# Patient Record
Sex: Female | Born: 2000 | Hispanic: Yes | Marital: Married | Smoking: Never smoker
Health system: Southern US, Community
[De-identification: ages and names within clinical notes are randomized; demographics above are authoritative.]

## PROBLEM LIST (undated history)

## (undated) ENCOUNTER — Emergency Department: Discharge status: Absent without leave | Payer: Self-pay

## (undated) ENCOUNTER — Inpatient Hospital Stay (HOSPITAL_COMMUNITY): Payer: Self-pay

## (undated) DIAGNOSIS — R519 Headache, unspecified: Secondary | ICD-10-CM

## (undated) DIAGNOSIS — K219 Gastro-esophageal reflux disease without esophagitis: Secondary | ICD-10-CM

## (undated) DIAGNOSIS — N83209 Unspecified ovarian cyst, unspecified side: Secondary | ICD-10-CM

## (undated) HISTORY — PX: NO PAST SURGERIES: SHX2092

## (undated) HISTORY — DX: Gastro-esophageal reflux disease without esophagitis: K21.9

---
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
## 2021-01-25 NOTE — Telephone Encounter (Signed)
Appointment has been schedule for depo

## 2021-02-01 ENCOUNTER — Ambulatory Visit: Payer: Medicaid Other

## 2021-02-01 ENCOUNTER — Ambulatory Visit (INDEPENDENT_AMBULATORY_CARE_PROVIDER_SITE_OTHER): Payer: Medicaid Other

## 2021-02-01 ENCOUNTER — Other Ambulatory Visit: Payer: Self-pay

## 2021-02-01 DIAGNOSIS — Z3042 Encounter for surveillance of injectable contraceptive: Secondary | ICD-10-CM | POA: Diagnosis not present

## 2021-02-01 MED ORDER — MEDROXYPROGESTERONE ACETATE 150 MG/ML IM SUSP
150.0000 mg | Freq: Once | INTRAMUSCULAR | Status: AC
  Administered 2021-02-01: 150 mg via INTRAMUSCULAR

## 2021-03-27 ENCOUNTER — Emergency Department (HOSPITAL_COMMUNITY)
Admission: EM | Admit: 2021-03-27 | Discharge: 2021-03-27 | Discharge status: Against Medical Advice | Payer: Medicaid Other | Attending: Emergency Medicine | Admitting: Emergency Medicine

## 2021-03-27 ENCOUNTER — Other Ambulatory Visit: Payer: Self-pay

## 2021-03-27 ENCOUNTER — Encounter (HOSPITAL_COMMUNITY): Payer: Self-pay | Admitting: Emergency Medicine

## 2021-03-27 DIAGNOSIS — R519 Headache, unspecified: Secondary | ICD-10-CM | POA: Insufficient documentation

## 2021-03-27 DIAGNOSIS — Z5321 Procedure and treatment not carried out due to patient leaving prior to being seen by health care provider: Secondary | ICD-10-CM | POA: Insufficient documentation

## 2021-03-27 NOTE — ED Triage Notes (Signed)
Pt c/o headache since 2300 last night; has taken 1 dose of ibuprofen and 1 dose of tylenol  ?

## 2021-03-28 ENCOUNTER — Encounter (HOSPITAL_COMMUNITY): Payer: Self-pay

## 2021-03-28 ENCOUNTER — Ambulatory Visit (HOSPITAL_COMMUNITY)
Admission: RE | Admit: 2021-03-28 | Discharge: 2021-03-28 | Disposition: A | Discharge status: Home / Self Care | Payer: Medicaid Other | Source: Ambulatory Visit | Attending: Emergency Medicine | Admitting: Emergency Medicine

## 2021-03-28 VITALS — BP 109/74 | HR 86 | Temp 98.1°F | Resp 17

## 2021-03-28 DIAGNOSIS — G43009 Migraine without aura, not intractable, without status migrainosus: Secondary | ICD-10-CM | POA: Diagnosis not present

## 2021-03-28 MED ORDER — IBUPROFEN 800 MG PO TABS: 800.0000 mg | ORAL_TABLET | Freq: Three times a day (TID) | ORAL | 0 refills | Status: DC

## 2021-03-28 MED ORDER — KETOROLAC TROMETHAMINE 60 MG/2ML IM SOLN
INTRAMUSCULAR | Status: AC
  Filled 2021-03-28: qty 2

## 2021-03-28 MED ORDER — METOCLOPRAMIDE HCL 5 MG/ML IJ SOLN
INTRAMUSCULAR | Status: AC
  Filled 2021-03-28: qty 2

## 2021-03-28 MED ORDER — DEXAMETHASONE SODIUM PHOSPHATE 10 MG/ML IJ SOLN
INTRAMUSCULAR | Status: AC
  Filled 2021-03-28: qty 1

## 2021-03-28 MED ORDER — SUMATRIPTAN SUCCINATE 6 MG/0.5ML ~~LOC~~ SOLN
SUBCUTANEOUS | Status: AC
  Filled 2021-03-28: qty 0.5

## 2021-03-28 MED ORDER — METOCLOPRAMIDE HCL 5 MG/ML IJ SOLN
10.0000 mg | Freq: Once | INTRAMUSCULAR | Status: AC
  Administered 2021-03-28: 10 mg via INTRAMUSCULAR

## 2021-03-28 MED ORDER — DEXAMETHASONE SODIUM PHOSPHATE 10 MG/ML IJ SOLN
10.0000 mg | Freq: Once | INTRAMUSCULAR | Status: AC
  Administered 2021-03-28: 10 mg via INTRAMUSCULAR

## 2021-03-28 MED ORDER — SUMATRIPTAN SUCCINATE 25 MG PO TABS: 25.0000 mg | ORAL_TABLET | ORAL | 0 refills | Status: DC | PRN

## 2021-03-28 MED ORDER — SUMATRIPTAN SUCCINATE 6 MG/0.5ML ~~LOC~~ SOLN
6.0000 mg | Freq: Once | SUBCUTANEOUS | Status: AC
  Administered 2021-03-28: 6 mg via SUBCUTANEOUS

## 2021-03-28 MED ORDER — KETOROLAC TROMETHAMINE 30 MG/ML IJ SOLN
30.0000 mg | Freq: Once | INTRAMUSCULAR | Status: AC
  Administered 2021-03-28: 30 mg via INTRAMUSCULAR

## 2021-03-28 NOTE — ED Provider Notes (Signed)
?[facility] ? ? ? ?CSN: [id number] ?Arrival date & time: 03/28/21  1743 ? ? ?  ? ?History   ?Chief Complaint ?Chief Complaint  ?Patient presents with  ? appt 1800  ? Headache  ? ? ?HPI ?Erin Strickland is a 21 y.o. female.  ? ?Patient presents with a generalized headache for 3 days, endorses headache has been constant but fluctuating in intensity.  Associated phonophobia, photophobia, nausea without vomiting and lightheadedness.  Has attempted use of ibuprofen, Excedrin and Tylenol which has not been helpful.  Denies dizziness, syncope, generalized weakness, changes in speech or memory.  Has had similar headache before occurring many years ago. ? ? ?History reviewed. No pertinent past medical history. ? ?Patient Active Problem List  ? Diagnosis Date Noted  ? 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 Acute pain of left shoulder 01/20/2020  ? Neck nodule 01/20/2020  ? ? ?History reviewed. No pertinent surgical history. ? ?OB History   ?No obstetric history on file. ?  ? ? ? ?Home Medications   ? ?Prior to Admission medications   ?Not on File  ? ? ?Family History ?No family history on file. ? ?Social History ?Social History  ? ?Tobacco Use  ? Smoking status: Never  ?  Passive exposure: Yes  ? Smokeless tobacco: Never  ? Tobacco comments:  ?  mom smokes outside  ?Substance Use Topics  ? Alcohol use: Not Currently  ? Drug use: Not Currently  ? ? ? ?Allergies   ?Patient has no known allergies. ? ? ?Review of Systems ?Review of Systems  ?HENT: Negative.    ?Eyes:  Positive for photophobia. Negative for pain, discharge, redness, itching and visual disturbance.  ?Respiratory: Negative.    ?Cardiovascular: Negative.   ?Gastrointestinal:  Positive for nausea. Negative for abdominal distention, abdominal pain, anal bleeding, blood in stool, constipation, diarrhea, rectal pain and vomiting.  ?Skin: Negative.   ?Neurological:  Positive for light-headedness and headaches. Negative for dizziness, tremors,  seizures, syncope, facial asymmetry, speech difficulty, weakness and numbness.  ? ? ?Physical Exam ?Triage Vital Signs ?ED Triage Vitals  ?Enc Vitals Group  ?   BP 03/28/21 1822 109/74  ?   Pulse Rate 03/28/21 1822 86  ?   Resp 03/28/21 1822 17  ?   Temp 03/28/21 1822 98.1 ?F (36.7 ?C)  ?   Temp Source 03/28/21 1822 Oral  ?   SpO2 03/28/21 1822 97 %  ?   Weight --   ?   Height --   ?   Head Circumference --   ?   Peak Flow --   ?   Pain Score 03/28/21 1820 8  ?   Pain Loc --   ?   Pain Edu? --   ?   Excl. in GC? --   ? ?No data found. ? ?Updated Vital Signs ?BP 109/74 (BP Location: Right Arm)   Pulse 86   Temp 98.1 ?F (36.7 ?C) (Oral)   Resp 17   LMP 03/24/2021 (Approximate)   SpO2 97%  ? ?Visual Acuity ?Right Eye Distance:   ?Left Eye Distance:   ?Bilateral Distance:   ? ?Right Eye Near:   ?Left Eye Near:    ?Bilateral Near:    ? ?Physical Exam ?Constitutional:   ?   Appearance: Normal appearance. She is well-developed.  ?HENT:  ?   Head: Normocephalic.  ?Eyes:  ?   General: No visual field deficit. ?   Extraocular Movements: Extraocular movements  intact.  ?Pulmonary:  ?   Effort: Pulmonary effort is normal.  ?Neurological:  ?   General: No focal deficit present.  ?   Mental Status: She is alert and oriented to person, place, and time. Mental status is at baseline.  ?   Cranial Nerves: No cranial nerve deficit or facial asymmetry.  ?   Sensory: No sensory deficit.  ?   Motor: No weakness.  ?Psychiatric:     ?   Mood and Affect: Mood normal.     ?   Behavior: Behavior normal.  ? ? ? ?UC Treatments / Results  ?Labs ?(all labs ordered are listed, but only abnormal results are displayed) ?Labs Reviewed - No data to display ? ?EKG ? ? ?Radiology ?No results found. ? ?Procedures ?Procedures (including critical care time) ? ?Medications Ordered in UC ?Medications - No data to display ? ?Initial Impression / Assessment and Plan / UC Course  ?I have reviewed the triage vital signs and the nursing notes. ? ?Pertinent  labs & imaging results that were available during my care of the patient were reviewed by me and considered in my medical decision making (see chart for details). ? ?Migraine without aura and without status migrainosus, not irretractable ? ?Vital signs are stable, patient is in no signs of distress, no abnormality noted on neurological exam, will move forward with treatment of symptoms, Toradol, Imitrex, Decadron and Reglan injection given in office, ibuprofen and Imitrex prescribed outpatient management, advised patient to ensure that she is getting adequate rest, drinking increased fluids through use of water to promote hydration and avoiding use of caffeine to decrease irritation to her head, recommended patient follow-up with PCP if migraines are reoccurring and persisted, for headache increasing in intensity patient will need to go there emergency department for follow-up ?Final Clinical Impressions(s) / UC Diagnoses  ? ?Final diagnoses:  ?None  ? ?Discharge Instructions   ?None ?  ? ?ED Prescriptions   ?None ?  ? ?PDMP not reviewed this encounter. ?  ?Valinda Hoar, NP ?03/28/21 1849 ? ?

## 2021-03-28 NOTE — ED Triage Notes (Signed)
Pt c/o headache since Sunday with some nausea. Took ibuprofen Sunday night. Monday took other OTC meds and today took OTC headaches medication.  ?

## 2021-03-28 NOTE — Discharge Instructions (Signed)
Today you are being treated for migraine headache ? ?You have been given injections in your in the office to help reduce the pressure and inflammation associated with headaches which in turn should help with your pain ? ?While at home begin use of Tylenol or ibuprofen as a first attempt to manage her headache ? ?If these are ineffective you may attempt use of Imitrex, take 1 tablet then wait 2 hours, if headache is still present you may take another dose, do not ever take more than 4 tablets in a 24-hour. ? ?If your headaches are reoccurring or persistent you will need to follow-up with your primary doctor for a full evaluation for causes of your headache ? ?If headache worsens in intensity or severity you will need to go to the nearest emergency department for further evaluation ?

## 2021-03-29 ENCOUNTER — Ambulatory Visit: Payer: Self-pay | Admitting: *Deleted

## 2021-03-29 ENCOUNTER — Encounter: Payer: Self-pay | Admitting: Family Medicine

## 2021-03-29 NOTE — Telephone Encounter (Signed)
?  Chief Complaint: headache ?Symptoms: headache ?Frequency: constant ?Pertinent Negatives: Patient denies fever ?Disposition: [] ED /[] Urgent Care (no appt availability in office) / [x] Appointment(In office/virtual)/ []  [place] [facility]/ [] Home Care/ [] Refused Recommended Disposition /[] [facility]/ []  Follow-up with PCP ?Additional Notes: Pt seen in ED already, given Migraine cocktail helped for a while. Given Imitrex rx but has not filled it. Encouraged her to fill it. Brother (in household) has COVID. She home tested negative. Virtual visit scheduled tomorrow. ? ? ?Reason for Disposition ? [1] MODERATE headache (e.g., interferes with normal activities) AND [2] present > 24 hours AND [3] unexplained  (Exceptions: analgesics not tried, typical migraine, or headache part of viral illness) ? ?Answer Assessment - Initial Assessment Questions ?1. LOCATION: "Where does it hurt?"  ?    Front and temples ?2. ONSET: "When did the headache start?" (Minutes, hours or days)  ?    Sunday ?3. PATTERN: "Does the pain come and go, or has it been constant since it started?" ?    constant ?4. SEVERITY: "How bad is the pain?" and "What does it keep you from doing?"  (e.g., Scale 1-10; mild, moderate, or severe) ?  - MILD (1-3): doesn't interfere with normal activities  ?  - MODERATE (4-7): interferes with normal activities or awakens from sleep  ?  - SEVERE (8-10): excruciating pain, unable to do any normal activities    ?    9 ?5. RECURRENT SYMPTOM: "Have you ever had headaches before?" If Yes, ask: "When was the last time?" and "What happened that time?"  ?    no ?6. CAUSE: "What do you think is causing the headache?" ?    Brother is positive for COVID but she has tested negative with home tesy ?7. MIGRAINE: "Have you been diagnosed with migraine headaches?" If Yes, ask: "Is this headache similar?"  ?    no ?8. HEAD INJURY: "Has there been any recent injury to the head?"  ?    no ?9. OTHER SYMPTOMS: "Do you have  any other symptoms?" (fever, stiff neck, eye pain, sore throat, cold symptoms) ?    Throat feels itchy ?10. PREGNANCY: "Is there any chance you are pregnant?" "When was your last menstrual period?" ?      LMP on cycle now ? ?Protocols used: Headache-A-AH ? ?

## 2021-03-30 ENCOUNTER — Telehealth (INDEPENDENT_AMBULATORY_CARE_PROVIDER_SITE_OTHER): Payer: Medicaid Other | Admitting: Physician Assistant

## 2021-03-30 ENCOUNTER — Encounter: Payer: Self-pay | Admitting: Physician Assistant

## 2021-03-30 DIAGNOSIS — G43009 Migraine without aura, not intractable, without status migrainosus: Secondary | ICD-10-CM | POA: Diagnosis not present

## 2021-03-30 NOTE — Progress Notes (Signed)
?Virtual Visit Consent  ? ?Erin Strickland, you are scheduled for a virtual visit with a [facility] provider today.   ?  ?Just as with appointments in the office, your consent must be obtained to participate.  Your consent will be active for this visit and any virtual visit you may have with one of our providers in the next 365 days.   ?  ?If you have a MyChart account, a copy of this consent can be sent to you electronically.  All virtual visits are billed to your insurance company just like a traditional visit in the office.   ? ?As this is a virtual visit, video technology does not allow for your provider to perform a traditional examination.  This may limit your provider's ability to fully assess your condition.  If your provider identifies any concerns that need to be evaluated in person or the need to arrange testing (such as labs, EKG, etc.), we will make arrangements to do so.   ?  ?Although advances in technology are sophisticated, we cannot ensure that it will always work on either your end or our end.  If the connection with a video visit is poor, the visit may have to be switched to a telephone visit.  With either a video or telephone visit, we are not always able to ensure that we have a secure connection.    ? ?I need to obtain your verbal consent now.   Are you willing to proceed with your visit today?  ?  ?Erin Strickland has provided verbal consent on 03/30/2021 for a virtual visit (video or telephone). ?  ?Roney Jaffe, PA-C  ? ?Date: 03/30/2021 8:16 AM ? ? ?Virtual Visit via Video Note  ? ?I, Roney Jaffe, connected with  Erin Strickland  ([id number], May 21, 2000) on 03/30/21 at  8:20 AM EDT by a video-enabled telemedicine application and verified that I am speaking with the correct person using two identifiers. ? ?Location: ?Patient: Virtual Visit Location Patient: Home ?Provider: Virtual Visit Location Provider: Office/Clinic ?  ?I discussed the limitations of evaluation and management by  telemedicine and the availability of in person appointments. The patient expressed understanding and agreed to proceed.   ? ?History of Present Illness: ?Erin Strickland is a 21 y.o. who identifies as a female who was assigned female at birth, and is being seen today for migraine headache.   ? ?States that she was seen in UC on 03/28/21 with same complaint.  Note from visit ? ?Patient presents with a generalized headache for 3 days, endorses headache has been constant but fluctuating in intensity.  Associated phonophobia, photophobia, nausea without vomiting and lightheadedness.  Has attempted use of ibuprofen, Excedrin and Tylenol which has not been helpful.  Denies dizziness, syncope, generalized weakness, changes in speech or memory.  Has had similar headache before occurring many years ago. ?  ?Migraine without aura and without status migrainosus, not irretractable ? ?Vital signs are stable, patient is in no signs of distress, no abnormality noted on neurological exam, will move forward with treatment of symptoms, Toradol, Imitrex, Decadron and Reglan injection given in office, ibuprofen and Imitrex prescribed outpatient management, advised patient to ensure that she is getting adequate rest, drinking increased fluids through use of water to promote hydration and avoiding use of caffeine to decrease irritation to her head, recommended patient follow-up with PCP if migraines are reoccurring and persisted, for headache increasing in intensity patient will need to go there emergency department for follow-up ? ?  States today that she did take a sumatriptan last night and it did offer relief.  No other concerns at this time  ? ?HPI: HPI  ?Problems:  ?Patient Active Problem List  ? Diagnosis Date Noted  ? 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 Acute pain of left shoulder 01/20/2020  ? Neck nodule 01/20/2020  ?  ?Allergies: No Known Allergies ?Medications:  ?Current Outpatient Medications:  ?  ibuprofen  (ADVIL) 800 MG tablet, Take 1 tablet (800 mg total) by mouth 3 (three) times daily., Disp: 21 tablet, Rfl: 0 ?  SUMAtriptan (IMITREX) 25 MG tablet, Take 1 tablet (25 mg total) by mouth every 2 (two) hours as needed for migraine. May repeat in 2 hours if headache persists or recurs., Disp: 10 tablet, Rfl: 0 ? ?Observations/Objective: ?Patient is well-developed, well-nourished in no acute distress.  ?Resting comfortably  at home.  ?Head is normocephalic, atraumatic.  ?No labored breathing.  ?Speech is clear and coherent with logical content.  ?Patient is alert and oriented at baseline.  ? ? ?Assessment and Plan: ?There are no diagnoses linked to this encounter. ?1. Migraine without aura and without status migrainosus, not intractable ?Continue current regimen as prescribed by UC.  Patient education given on supportive care.   ? ? ?Follow Up Instructions: ?I discussed the assessment and treatment plan with the patient. The patient was provided an opportunity to ask questions and all were answered. The patient agreed with the plan and demonstrated an understanding of the instructions.  A copy of instructions were sent to the patient via MyChart unless otherwise noted below.  ? ? ? ?The patient was advised to call back or seek an in-person evaluation if the symptoms worsen or if the condition fails to improve as anticipated. ? ?Time:  ?I spent 12 minutes with the patient via telehealth technology discussing the above problems/concerns.   ? ?Taijuan Serviss S Mayers, PA-C ? ?

## 2021-03-30 NOTE — Patient Instructions (Signed)
?Erin Strickland, thank you for joining Roney Jaffe, PA-C for today's virtual visit.  While this provider is not your primary care provider (PCP), if your PCP is located in our provider database this encounter information will be shared with them immediately following your visit. ? ?Consent: ?(Patient) Erin Strickland provided verbal consent for this virtual visit at the beginning of the encounter. ? ?Current Medications: ? ?Current Outpatient Medications:  ?  ibuprofen (ADVIL) 800 MG tablet, Take 1 tablet (800 mg total) by mouth 3 (three) times daily., Disp: 21 tablet, Rfl: 0 ?  SUMAtriptan (IMITREX) 25 MG tablet, Take 1 tablet (25 mg total) by mouth every 2 (two) hours as needed for migraine. May repeat in 2 hours if headache persists or recurs., Disp: 10 tablet, Rfl: 0  ? ?Medications ordered in this encounter:  ?No orders of the defined types were placed in this encounter. ?  ? ?*If you need refills on other medications prior to your next appointment, please contact your pharmacy* ? ?Follow-Up: ?Call back or seek an in-person evaluation if the symptoms worsen or if the condition fails to improve as anticipated. ? ?Other Instructions ?I hope that you fel better soon! ? ?Migraine Headache ?A migraine headache is an intense, throbbing pain on one side or both sides of the head. Migraine headaches may also cause other symptoms, such as nausea, vomiting, and sensitivity to light and noise. A migraine headache can last from 4 hours to 3 days. Talk with your doctor about what things may bring on (trigger) your migraine headaches. ?What are the causes? ?The exact cause of this condition is not known. However, a migraine may be caused when nerves in the brain become irritated and release chemicals that cause inflammation of blood vessels. This inflammation causes pain. This condition may be triggered or caused by: ?Drinking alcohol. ?Smoking. ?Taking medicines, such as: ?Medicine used to treat chest pain  (nitroglycerin). ?Birth control pills. ?Estrogen. ?Certain blood pressure medicines. ?Eating or drinking products that contain nitrates, glutamate, aspartame, or tyramine. Aged cheeses, chocolate, or caffeine may also be triggers. ?Doing physical activity. ?Other things that may trigger a migraine headache include: ?Menstruation. ?Pregnancy. ?Hunger. ?Stress. ?Lack of sleep or too much sleep. ?Weather changes. ?Fatigue. ?What increases the risk? ?The following factors may make you more likely to experience migraine headaches: ?Being a certain age. This condition is more common in people who are 52-10 years old. ?Being female. ?Having a family history of migraine headaches. ?Being Caucasian. ?Having a mental health condition, such as depression or anxiety. ?Being obese. ?What are the signs or symptoms? ?The main symptom of this condition is pulsating or throbbing pain. This pain may: ?Happen in any area of the head, such as on one side or both sides. ?Interfere with daily activities. ?Get worse with physical activity. ?Get worse with exposure to bright lights or loud noises. ?Other symptoms may include: ?Nausea. ?Vomiting. ?Dizziness. ?General sensitivity to bright lights, loud noises, or smells. ?Before you get a migraine headache, you may get warning signs (an aura). An aura may include: ?Seeing flashing lights or having blind spots. ?Seeing bright spots, halos, or zigzag lines. ?Having tunnel vision or blurred vision. ?Having numbness or a tingling feeling. ?Having trouble talking. ?Having muscle weakness. ?Some people have symptoms after a migraine headache (postdromal phase), such as: ?Feeling tired. ?Difficulty concentrating. ?How is this diagnosed? ?A migraine headache can be diagnosed based on: ?Your symptoms. ?A physical exam. ?Tests, such as: ?CT scan or an MRI of the head.  These imaging tests can help rule out other causes of headaches. ?Taking fluid from the spine (lumbar puncture) and analyzing it  (cerebrospinal fluid analysis, or CSF analysis). ?How is this treated? ?This condition may be treated with medicines that: ?Relieve pain. ?Relieve nausea. ?Prevent migraine headaches. ?Treatment for this condition may also include: ?Acupuncture. ?Lifestyle changes like avoiding foods that trigger migraine headaches. ?Biofeedback. ?Cognitive behavioral therapy. ?Follow these instructions at home: ?Medicines ?Take over-the-counter and prescription medicines only as told by your health care provider. ?Ask your health care provider if the medicine prescribed to you: ?Requires you to avoid driving or using heavy machinery. ?Can cause constipation. You may need to take these actions to prevent or treat constipation: ?Drink enough fluid to keep your urine pale yellow. ?Take over-the-counter or prescription medicines. ?Eat foods that are high in fiber, such as beans, whole grains, and fresh fruits and vegetables. ?Limit foods that are high in fat and processed sugars, such as fried or sweet foods. ?Lifestyle ?Do not drink alcohol. ?Do not use any products that contain nicotine or tobacco, such as cigarettes, e-cigarettes, and chewing tobacco. If you need help quitting, ask your health care provider. ?Get at least 8 hours of sleep every night. ?Find ways to manage stress, such as meditation, deep breathing, or yoga. ?General instructions ?  ?Keep a journal to find out what may trigger your migraine headaches. For example, write down: ?What you eat and drink. ?How much sleep you get. ?Any change to your diet or medicines. ?If you have a migraine headache: ?Avoid things that make your symptoms worse, such as bright lights. ?It may help to lie down in a dark, quiet room. ?Do not drive or use heavy machinery. ?Ask your health care provider what activities are safe for you while you are experiencing symptoms. ?Keep all follow-up visits as told by your health care provider. This is important. ?Contact a health care provider  if: ?You develop symptoms that are different or more severe than your usual migraine headache symptoms. ?You have more than 15 headache days in one month. ?Get help right away if: ?Your migraine headache becomes severe. ?Your migraine headache lasts longer than 72 hours. ?You have a fever. ?You have a stiff neck. ?You have vision loss. ?Your muscles feel weak or like you cannot control them. ?You start to lose your balance often. ?You have trouble walking. ?You faint. ?You have a seizure. ?Summary ?A migraine headache is an intense, throbbing pain on one side or both sides of the head. Migraines may also cause other symptoms, such as nausea, vomiting, and sensitivity to light and noise. ?This condition may be treated with medicines and lifestyle changes. You may also need to avoid certain things that trigger a migraine headache. ?Keep a journal to find out what may trigger your migraine headaches. ?Contact your health care provider if you have more than 15 headache days in a month or you develop symptoms that are different or more severe than your usual migraine headache symptoms. ?This information is not intended to replace advice given to you by your health care provider. Make sure you discuss any questions you have with your health care provider. ?Document Revised: 04/11/2018 Document Reviewed: 01/30/2018 ?Elsevier Patient Education ? 2022 [employer] ? ?

## 2021-03-30 NOTE — Telephone Encounter (Signed)
Patient was called back. Patient said that she is feeling a little better today and she was able to get through a day of work. Patient said she was going to get her eyes checked to see if that has something to do with her migraines ?

## 2021-04-01 DIAGNOSIS — H5213 Myopia, bilateral: Secondary | ICD-10-CM | POA: Diagnosis not present

## 2021-04-26 ENCOUNTER — Ambulatory Visit (INDEPENDENT_AMBULATORY_CARE_PROVIDER_SITE_OTHER): Payer: Medicaid Other

## 2021-04-26 DIAGNOSIS — Z3042 Encounter for surveillance of injectable contraceptive: Secondary | ICD-10-CM | POA: Diagnosis not present

## 2021-04-26 MED ORDER — MEDROXYPROGESTERONE ACETATE 150 MG/ML IM SUSP
150.0000 mg | Freq: Once | INTRAMUSCULAR | Status: AC
  Administered 2021-04-26: 150 mg via INTRAMUSCULAR

## 2021-04-26 NOTE — Progress Notes (Signed)
Patient was given depo shot a tolerated it well. Patient had no questions ?

## 2021-06-02 ENCOUNTER — Ambulatory Visit
Admission: RE | Admit: 2021-06-02 | Discharge: 2021-06-02 | Disposition: A | Discharge status: Home / Self Care | Payer: Medicaid Other | Source: Ambulatory Visit | Attending: Internal Medicine | Admitting: Internal Medicine

## 2021-06-02 ENCOUNTER — Ambulatory Visit: Payer: Self-pay | Admitting: *Deleted

## 2021-06-02 VITALS — BP 103/71 | HR 88 | Temp 98.7°F | Resp 18 | Ht 64.0 in | Wt 135.0 lb

## 2021-06-02 DIAGNOSIS — G43019 Migraine without aura, intractable, without status migrainosus: Secondary | ICD-10-CM

## 2021-06-02 MED ORDER — DEXAMETHASONE SODIUM PHOSPHATE 10 MG/ML IJ SOLN
10.0000 mg | Freq: Once | INTRAMUSCULAR | Status: AC
  Administered 2021-06-02: 10 mg via INTRAMUSCULAR

## 2021-06-02 NOTE — Telephone Encounter (Signed)
  Chief Complaint: migraine Symptoms: headache, light sensitive Frequency: started yesterday Pertinent Negatives: Patient denies fever, stiff neck, eye pain, sore throat Disposition: [] ED /[x] Urgent Care (no appt availability in office) / [] Appointment(In office/virtual)/ []  [facility]/ [] Home Care/ [] Refused Recommended Disposition /[] [facility]/ []  Follow-up with PCP Additional Notes: Patient advised needs office follow up- also advised UC for today

## 2021-06-02 NOTE — ED Provider Notes (Signed)
Erin Strickland    CSN: [id number] Arrival date & time: 06/02/21  1152      History   Chief Complaint Chief Complaint  Patient presents with   Migraine    Migraine came back medication is not working - Entered by patient   Appointment    HPI Erin Strickland is a 21 y.o. female.   Patient presents with migraine that started yesterday.  Patient does have a history of migraines.  Patient reports that triggering factors seem to be stress.  Denies any associated dizziness, blurred vision, nausea, vomiting, chest pain, shortness of breath.  Patient has taken Imitrex and 800 mg ibuprofen that was prescribed in March with a migraine in urgent Strickland.  Patient has never seen specialist for migraines. Denies improvement with medications.  Headache is present in the bilateral sides of her head which is typical for her migraines.   Migraine   History reviewed. No pertinent past medical history.  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 No pertinent surgical history.  OB History   No obstetric history on file.      Home Medications    Prior to Admission medications   Medication Sig Start Date End Date Taking? Authorizing Provider  ibuprofen (ADVIL) 800 MG tablet Take 1 tablet (800 mg total) by mouth 3 (three) times daily. 03/28/21  Yes White, Elita Boone, NP  SUMAtriptan (IMITREX) 25 MG tablet Take 1 tablet (25 mg total) by mouth every 2 (two) hours as needed for migraine. May repeat in 2 hours if headache persists or recurs. 03/28/21  Yes Valinda Hoar, NP    Family History History reviewed. No pertinent family history.  Social History Social History   Tobacco Use   Smoking status: Never    Passive exposure: Yes   Smokeless tobacco: Never   Tobacco comments:    mom smokes outside  Substance Use Topics    Alcohol use: Not Currently   Drug use: Not Currently     Allergies   Patient has no known allergies.   Review of Systems Review of Systems Per HPI  Physical Exam Triage Vital Signs ED Triage Vitals  Enc Vitals Group     BP 06/02/21 1250 103/71     Pulse Rate 06/02/21 1250 88     Resp 06/02/21 1250 18     Temp 06/02/21 1250 98.7 F (37.1 C)     Temp Source 06/02/21 1250 Oral     SpO2 06/02/21 1250 96 %     Weight 06/02/21 1252 135 lb (61.2 kg)     Height 06/02/21 1252 5\' 4"  (1.626 m)     Head Circumference --      Peak Flow --      Pain Score 06/02/21 1252 6     Pain Loc --      Pain Edu? --      Excl. in GC? --    No data found.  Updated Vital Signs BP 103/71 (BP Location: Left Arm)   Pulse 88   Temp 98.7 F (37.1 C) (Oral)   Resp 18   Ht 5\' 4"  (1.626 m)   Wt 135 lb (61.2 kg)   LMP 05/19/2021   SpO2 96%   BMI 23.17 kg/m   Visual Acuity Right Eye Distance:   Left Eye Distance:  Bilateral Distance:    Right Eye Near:   Left Eye Near:    Bilateral Near:     Physical Exam Constitutional:      General: She is not in acute distress.    Appearance: Normal appearance. She is not toxic-appearing or diaphoretic.  HENT:     Head: Normocephalic and atraumatic.  Eyes:     Extraocular Movements: Extraocular movements intact.     Conjunctiva/sclera: Conjunctivae normal.     Pupils: Pupils are equal, round, and reactive to light.  Cardiovascular:     Rate and Rhythm: Normal rate and regular rhythm.     Pulses: Normal pulses.     Heart sounds: Normal heart sounds.  Pulmonary:     Effort: Pulmonary effort is normal. No respiratory distress.     Breath sounds: Normal breath sounds.  Neurological:     General: No focal deficit present.     Mental Status: She is alert and oriented to person, place, and time. Mental status is at baseline.     Cranial Nerves: Cranial nerves 2-12 are intact.     Sensory: Sensation is intact.     Motor: Motor function is intact.      Coordination: Coordination is intact.     Gait: Gait is intact.  Psychiatric:        Mood and Affect: Mood normal.        Behavior: Behavior normal.        Thought Content: Thought content normal.        Judgment: Judgment normal.     UC Treatments / Results  Labs (all labs ordered are listed, but only abnormal results are displayed) Labs Reviewed - No data to display  EKG   Radiology No results found.  Procedures Procedures (including critical Strickland time)  Medications Ordered in UC Medications  dexamethasone (DECADRON) injection 10 mg (10 mg Intramuscular Given 06/02/21 1319)    Initial Impression / Assessment and Plan / UC Course  I have reviewed the triage vital signs and the nursing notes.  Pertinent labs & imaging results that were available during my Strickland of the patient were reviewed by me and considered in my medical decision making (see chart for details).     Patient has history of migraines and symptoms are consistent with this.  Neuro exam is also normal so do not think that emergent evaluation at the hospital or CT imaging of the head is necessary at this time.  Due to duration of time since 800 mg ibuprofen was administered, will avoid IM ketorolac at this time.  Patient also does not have any nausea so will avoid Zofran or Reglan at this time.  IM Decadron was administered in urgent Strickland to help alleviate migraine today.  Patient was given strict ER precautions and advised to go to the ER if symptoms persist or worsen in the next 24 to 48 hours.  Patient was also given contact information for headache clinic given that she has never seen a specialist and is having more frequent migraines.  Patient verbalized understanding and was agreeable with plan. Final Clinical Impressions(s) / UC Diagnoses   Final diagnoses:  Intractable migraine without aura and without status migrainosus     Discharge Instructions      You were given a steroid injection today in  urgent Strickland to help alleviate migraine.  Please go to the hospital if symptoms do not improve or if they worsen in the next 24 to 48 hours.  Please  avoid taking any ibuprofen, Advil, Aleve for at least 24 hours following injection.  Follow-up with headache clinic as well given persistent migraines.    ED Prescriptions   None    PDMP not reviewed this encounter.   Teodora Medici, [place] 06/02/21 1328

## 2021-06-02 NOTE — ED Triage Notes (Signed)
Patient c/o migraine since yesterday, no nausea or vomiting.  Patient has taken Imitrex and Ibuprofen w/o relief.

## 2021-06-02 NOTE — Telephone Encounter (Signed)
Summary: migraine   Pt called in stating she has been having a bad migraine, there were no available appts with any providers, and pt requested to speak with a nurse. Please advise.      Reason for Disposition  [1] SEVERE headache (e.g., excruciating) AND [2] not improved after 2 hours of pain medicine  Answer Assessment - Initial Assessment Questions 1. LOCATION: "Where does it hurt?"      Temples/forehead 2. ONSET: "When did the headache start?" (Minutes, hours or days)      yesterday 3. PATTERN: "Does the pain come and go, or has it been constant since it started?"     constant 4. SEVERITY: "How bad is the pain?" and "What does it keep you from doing?"  (e.g., Scale 1-10; mild, moderate, or severe)   - MILD (1-3): doesn't interfere with normal activities    - MODERATE (4-7): interferes with normal activities or awakens from sleep    - SEVERE (8-10): excruciating pain, unable to do any normal activities        severe 5. RECURRENT SYMPTOM: "Have you ever had headaches before?" If Yes, ask: "When was the last time?" and "What happened that time?"      Yes- migraine hx 6. CAUSE: "What do you think is causing the headache?"     migraine 7. MIGRAINE: "Have you been diagnosed with migraine headaches?" If Yes, ask: "Is this headache similar?"      yes 8. HEAD INJURY: "Has there been any recent injury to the head?"      no 9. OTHER SYMPTOMS: "Do you have any other symptoms?" (fever, stiff neck, eye pain, sore throat, cold symptoms)     Cold symptoms 10. PREGNANCY: "Is there any chance you are pregnant?" "When was your last menstrual period?"       No- OCP, normal cycles  Protocols used: Headache-A-AH

## 2021-06-02 NOTE — Discharge Instructions (Signed)
You were given a steroid injection today in urgent care to help alleviate migraine.  Please go to the hospital if symptoms do not improve or if they worsen in the next 24 to 48 hours.  Please avoid taking any ibuprofen, Advil, Aleve for at least 24 hours following injection.  Follow-up with headache clinic as well given persistent migraines.

## 2021-06-21 DIAGNOSIS — G43719 Chronic migraine without aura, intractable, without status migrainosus: Secondary | ICD-10-CM | POA: Diagnosis not present

## 2021-06-21 DIAGNOSIS — Z79899 Other long term (current) drug therapy: Secondary | ICD-10-CM | POA: Diagnosis not present

## 2021-06-21 DIAGNOSIS — G43839 Menstrual migraine, intractable, without status migrainosus: Secondary | ICD-10-CM | POA: Diagnosis not present

## 2021-06-21 DIAGNOSIS — Z049 Encounter for examination and observation for unspecified reason: Secondary | ICD-10-CM | POA: Diagnosis not present

## 2021-06-22 ENCOUNTER — Telehealth: Payer: Self-pay

## 2021-06-22 NOTE — Telephone Encounter (Signed)
FMLA inquiry

## 2021-06-26 NOTE — Telephone Encounter (Signed)
Patient was called and has been dx with migraines. She however need to have 3 visit with that provider before FMLA paper work can be done. Patient would like a temp letter saying that she has came in for migraines and was sent to a specialist.

## 2021-06-28 ENCOUNTER — Encounter: Payer: Self-pay | Admitting: *Deleted

## 2021-06-28 DIAGNOSIS — G518 Other disorders of facial nerve: Secondary | ICD-10-CM | POA: Diagnosis not present

## 2021-06-28 DIAGNOSIS — M791 Myalgia, unspecified site: Secondary | ICD-10-CM | POA: Diagnosis not present

## 2021-06-28 DIAGNOSIS — M542 Cervicalgia: Secondary | ICD-10-CM | POA: Diagnosis not present

## 2021-06-28 DIAGNOSIS — G43719 Chronic migraine without aura, intractable, without status migrainosus: Secondary | ICD-10-CM | POA: Diagnosis not present

## 2021-06-28 DIAGNOSIS — G43839 Menstrual migraine, intractable, without status migrainosus: Secondary | ICD-10-CM | POA: Diagnosis not present

## 2021-06-28 NOTE — Telephone Encounter (Signed)
Rolly Salter are you able to help this patient with a letter for her job?  [name]

## 2021-06-28 NOTE — Telephone Encounter (Signed)
Message sent to patient

## 2021-06-28 NOTE — Telephone Encounter (Signed)
Please advise pt

## 2021-07-12 DIAGNOSIS — G518 Other disorders of facial nerve: Secondary | ICD-10-CM | POA: Diagnosis not present

## 2021-07-12 DIAGNOSIS — M542 Cervicalgia: Secondary | ICD-10-CM | POA: Diagnosis not present

## 2021-07-12 DIAGNOSIS — G43719 Chronic migraine without aura, intractable, without status migrainosus: Secondary | ICD-10-CM | POA: Diagnosis not present

## 2021-07-12 DIAGNOSIS — G43839 Menstrual migraine, intractable, without status migrainosus: Secondary | ICD-10-CM | POA: Diagnosis not present

## 2021-07-12 DIAGNOSIS — M791 Myalgia, unspecified site: Secondary | ICD-10-CM | POA: Diagnosis not present

## 2021-07-19 ENCOUNTER — Ambulatory Visit (INDEPENDENT_AMBULATORY_CARE_PROVIDER_SITE_OTHER): Payer: Medicaid Other

## 2021-07-19 ENCOUNTER — Ambulatory Visit: Payer: Medicaid Other | Admitting: Family Medicine

## 2021-07-19 DIAGNOSIS — Z3042 Encounter for surveillance of injectable contraceptive: Secondary | ICD-10-CM | POA: Diagnosis not present

## 2021-07-19 MED ORDER — MEDROXYPROGESTERONE ACETATE 150 MG/ML IM SUSP
150.0000 mg | Freq: Once | INTRAMUSCULAR | Status: AC
  Administered 2021-07-19: 150 mg via INTRAMUSCULAR

## 2021-07-19 NOTE — Progress Notes (Signed)
Patient here for Depo-Provera injection. Still in her window. No c/o side effects. Shot given in R upper outer deltoid , tolerated ok.

## 2021-07-27 IMAGING — DX DG CHEST 2V
2 series · 2 of 2 positions shown · non-contrast
Comparison: None.

CLINICAL DATA: Chest pain

EXAM:
CHEST - 2 VIEW

[chest pa]
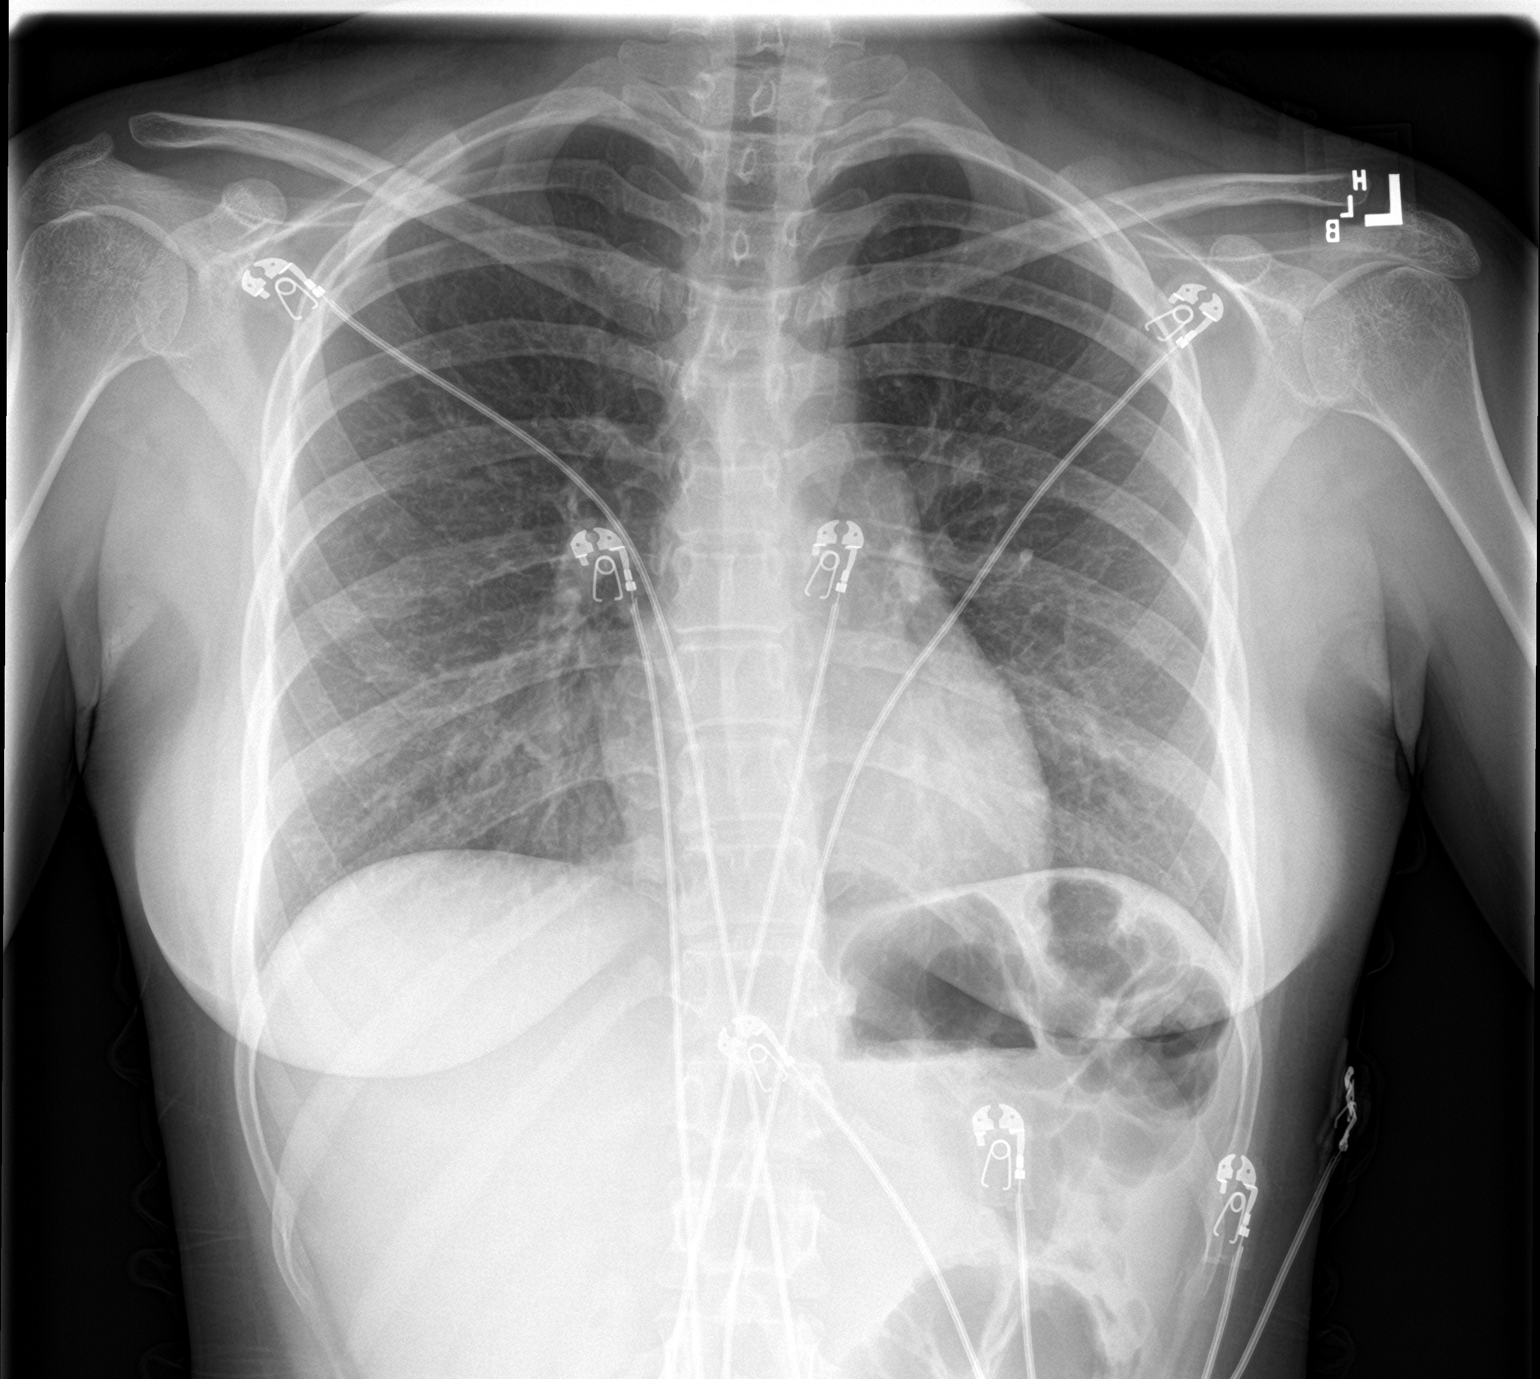

[chest lat]
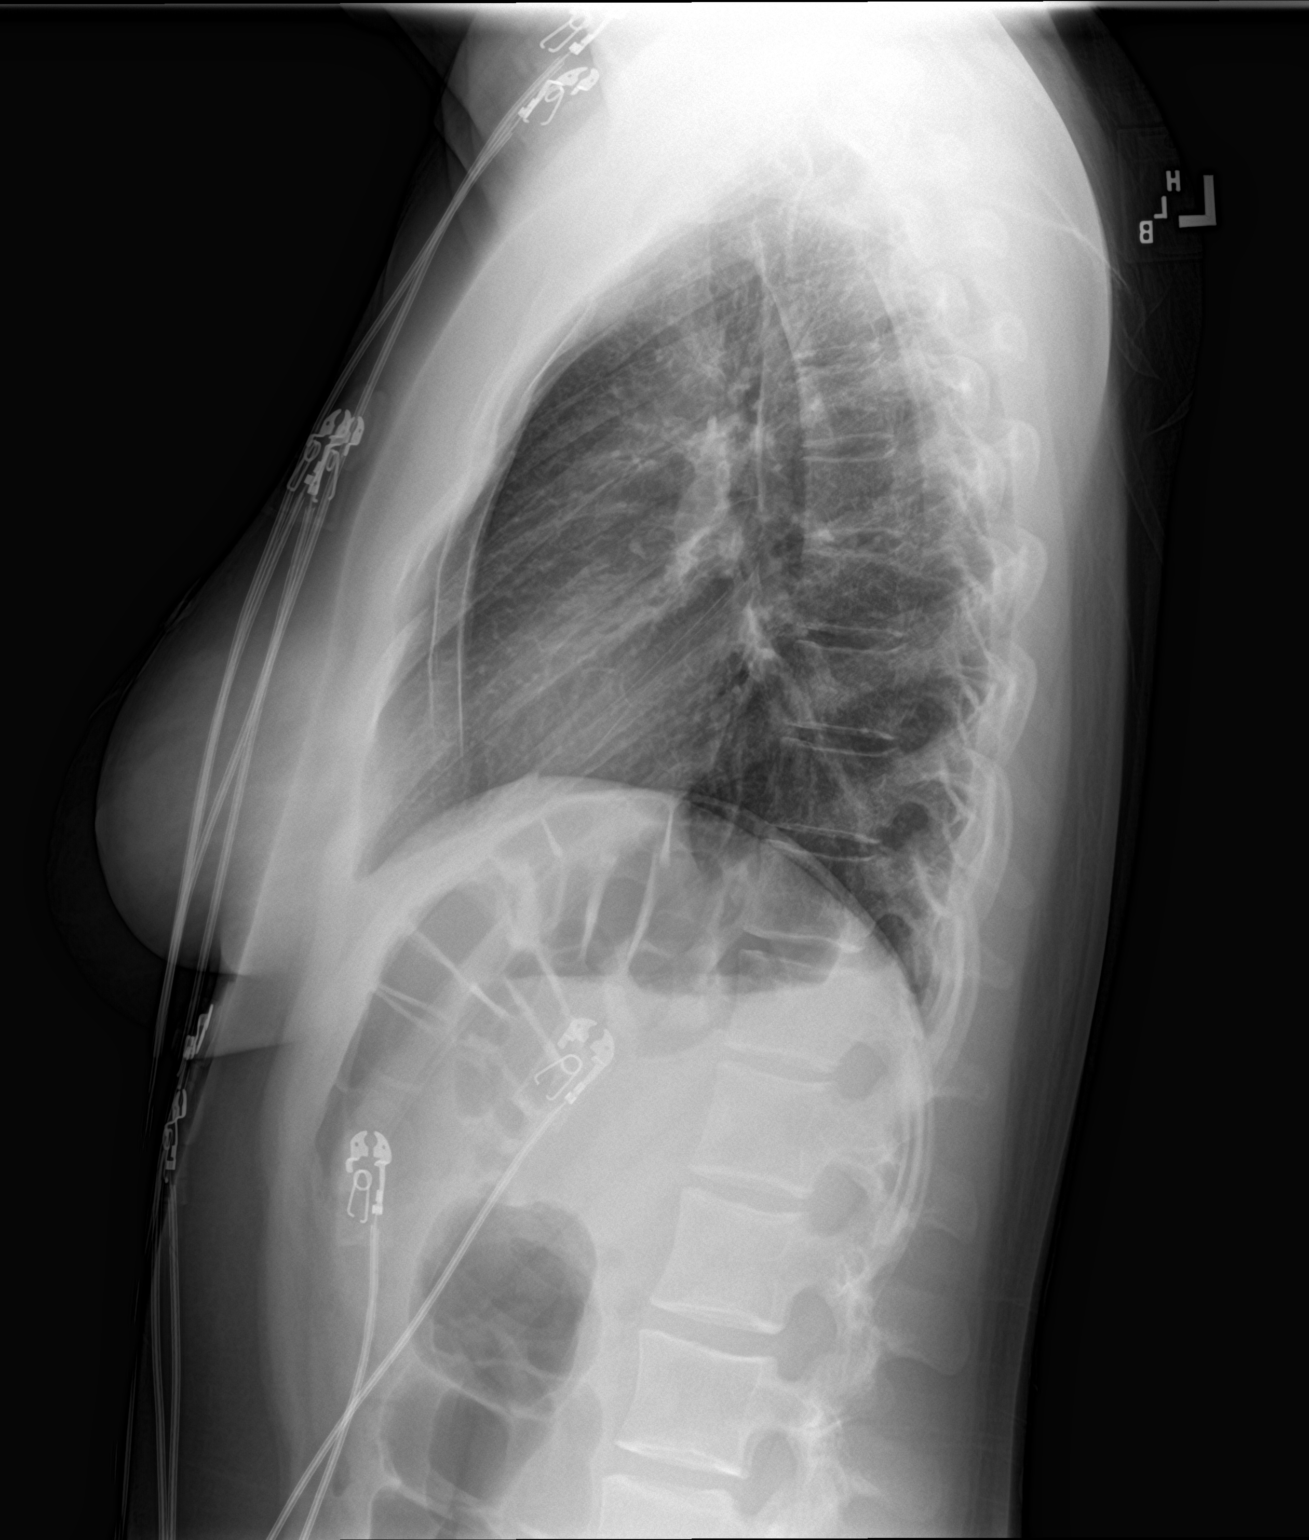

[2 of 2 positions shown; findings below may reference images not displayed]

FINDINGS: Heart and mediastinal contours are within normal limits. No focal
opacities or effusions. No acute bony abnormality.
IMPRESSION: No active cardiopulmonary disease.

## 2021-08-02 DIAGNOSIS — G518 Other disorders of facial nerve: Secondary | ICD-10-CM | POA: Diagnosis not present

## 2021-08-02 DIAGNOSIS — M791 Myalgia, unspecified site: Secondary | ICD-10-CM | POA: Diagnosis not present

## 2021-08-02 DIAGNOSIS — G43719 Chronic migraine without aura, intractable, without status migrainosus: Secondary | ICD-10-CM | POA: Diagnosis not present

## 2021-08-02 DIAGNOSIS — G43839 Menstrual migraine, intractable, without status migrainosus: Secondary | ICD-10-CM | POA: Diagnosis not present

## 2021-08-02 DIAGNOSIS — M542 Cervicalgia: Secondary | ICD-10-CM | POA: Diagnosis not present

## 2021-08-16 DIAGNOSIS — M542 Cervicalgia: Secondary | ICD-10-CM | POA: Diagnosis not present

## 2021-08-16 DIAGNOSIS — G43719 Chronic migraine without aura, intractable, without status migrainosus: Secondary | ICD-10-CM | POA: Diagnosis not present

## 2021-08-16 DIAGNOSIS — M791 Myalgia, unspecified site: Secondary | ICD-10-CM | POA: Diagnosis not present

## 2021-08-16 DIAGNOSIS — G518 Other disorders of facial nerve: Secondary | ICD-10-CM | POA: Diagnosis not present

## 2021-08-21 ENCOUNTER — Encounter: Payer: Self-pay | Admitting: Family Medicine

## 2021-08-21 ENCOUNTER — Encounter: Payer: Medicaid Other | Admitting: Family Medicine

## 2021-08-22 NOTE — Progress Notes (Signed)
Patient rescheduled her physical as she has already had one within the last year

## 2021-08-30 DIAGNOSIS — M791 Myalgia, unspecified site: Secondary | ICD-10-CM | POA: Diagnosis not present

## 2021-08-30 DIAGNOSIS — M542 Cervicalgia: Secondary | ICD-10-CM | POA: Diagnosis not present

## 2021-08-30 DIAGNOSIS — G518 Other disorders of facial nerve: Secondary | ICD-10-CM | POA: Diagnosis not present

## 2021-08-30 DIAGNOSIS — G43719 Chronic migraine without aura, intractable, without status migrainosus: Secondary | ICD-10-CM | POA: Diagnosis not present

## 2021-09-20 DIAGNOSIS — M542 Cervicalgia: Secondary | ICD-10-CM | POA: Diagnosis not present

## 2021-09-20 DIAGNOSIS — G43719 Chronic migraine without aura, intractable, without status migrainosus: Secondary | ICD-10-CM | POA: Diagnosis not present

## 2021-09-20 DIAGNOSIS — M791 Myalgia, unspecified site: Secondary | ICD-10-CM | POA: Diagnosis not present

## 2021-09-20 DIAGNOSIS — G518 Other disorders of facial nerve: Secondary | ICD-10-CM | POA: Diagnosis not present

## 2021-10-11 ENCOUNTER — Ambulatory Visit (INDEPENDENT_AMBULATORY_CARE_PROVIDER_SITE_OTHER): Payer: Medicaid Other

## 2021-10-11 DIAGNOSIS — Z23 Encounter for immunization: Secondary | ICD-10-CM

## 2021-10-11 DIAGNOSIS — Z3042 Encounter for surveillance of injectable contraceptive: Secondary | ICD-10-CM

## 2021-10-11 MED ORDER — MEDROXYPROGESTERONE ACETATE 150 MG/ML IM SUSP
150.0000 mg | Freq: Once | INTRAMUSCULAR | Status: AC
  Administered 2021-10-11: 150 mg via INTRAMUSCULAR

## 2022-01-02 ENCOUNTER — Ambulatory Visit: Payer: Medicaid Other

## 2022-01-11 ENCOUNTER — Ambulatory Visit: Payer: Medicaid Other

## 2022-02-13 ENCOUNTER — Ambulatory Visit: Payer: Medicaid Other | Admitting: Family Medicine

## 2022-03-15 ENCOUNTER — Other Ambulatory Visit: Payer: Self-pay

## 2022-03-15 ENCOUNTER — Ambulatory Visit (HOSPITAL_COMMUNITY)
Admission: RE | Admit: 2022-03-15 | Discharge: 2022-03-15 | Disposition: A | Discharge status: Home / Self Care | Payer: Medicaid Other | Source: Ambulatory Visit | Attending: Sports Medicine | Admitting: Sports Medicine

## 2022-03-15 ENCOUNTER — Encounter (HOSPITAL_COMMUNITY): Payer: Self-pay

## 2022-03-15 VITALS — BP 106/73 | HR 91 | Temp 98.3°F | Resp 18 | Ht 64.0 in | Wt 140.9 lb

## 2022-03-15 DIAGNOSIS — L600 Ingrowing nail: Secondary | ICD-10-CM

## 2022-03-15 MED ORDER — LIDOCAINE-EPINEPHRINE-TETRACAINE (LET) TOPICAL GEL
3.0000 mL | Freq: Once | TOPICAL | Status: AC
  Administered 2022-03-15: 1 mL via TOPICAL

## 2022-03-15 MED ORDER — TRIAMCINOLONE ACETONIDE 0.5 % EX OINT: 1.0000 | TOPICAL_OINTMENT | Freq: Two times a day (BID) | CUTANEOUS | 0 refills | Status: DC

## 2022-03-15 MED ORDER — LIDOCAINE 5 % EX OINT: 1.0000 | TOPICAL_OINTMENT | CUTANEOUS | 0 refills | Status: DC | PRN

## 2022-03-15 MED ORDER — LIDOCAINE-EPINEPHRINE-TETRACAINE (LET) TOPICAL GEL
TOPICAL | Status: AC
  Filled 2022-03-15: qty 3

## 2022-03-15 MED ORDER — IBUPROFEN 800 MG PO TABS: 800.0000 mg | ORAL_TABLET | Freq: Three times a day (TID) | ORAL | 0 refills | Status: DC

## 2022-03-15 MED ORDER — LIDOCAINE 5 % EX OINT: TOPICAL_OINTMENT | Freq: Once | CUTANEOUS | Status: DC

## 2022-03-15 NOTE — Discharge Instructions (Addendum)
Your toe pain is likely secondary to some irritation of the nailbed.  It does not appear infected at today's visit.  I would recommend continue with warm Epsom salt soaks 10 to 20 minutes at a time a couple times a day.  I have also sent to your pharmacy some oral ibuprofen that you can take up to 3 times a day for pain.  I have also sent some lidocaine, numbing, ointment and triamcinolone, steroid cream that can help with the swelling and pain.  Recommend follow-up with podiatry or foot and ankle doctor if your symptoms worsen or fail to improve.

## 2022-03-15 NOTE — ED Provider Notes (Signed)
[place]    CSN: [id number] Arrival date & time: 03/15/22  1256      History   Chief Complaint Chief Complaint  Patient presents with   Toe Pain    Have pain in my left big toe, affecting me walking - Entered by patient   Appointment    HPI Erin Strickland is a 22 y.o. female.   She is here today with her sister with chief complaint of left great toe pain on the inside part.  She reports she woke up with this discomfort and has been getting worse.  She tried a warm soak and some Tylenol for temporary relief of her pain.  Pain is worse when she is pushing down with her big toe or walking.  She has some tenderness to touch over the area.  She denies any history of this in the past.  Had a pedicure liver 3 weeks ago.   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 No pertinent surgical history.  OB History   No obstetric history on file.      Home Medications    Prior to Admission medications   Medication Sig Start Date End Date Taking? Authorizing Provider  lidocaine (XYLOCAINE) 5 % ointment Apply 1 Application topically as needed. 03/15/22  Yes Rodena Goldmann A, DO  triamcinolone ointment (KENALOG) 0.5 % Apply 1 Application topically 2 (two) times daily. 03/15/22  Yes Rodena Goldmann A, DO  baclofen (LIORESAL) 10 MG tablet Take by mouth as needed. 06/21/21   [provider]  ibuprofen (ADVIL) 800 MG tablet Take 1 tablet (800 mg total) by mouth 3 (three) times daily. 03/15/22   Elmore Guise, DO  SUMAtriptan (IMITREX) 25 MG tablet Take 1 tablet (25 mg total) by mouth every 2 (two) hours as needed for migraine. May repeat in 2 hours if headache persists or recurs. 03/28/21   White, Leitha Schuller, NP   zonisamide (ZONEGRAN) 25 MG capsule Take 100 mg by mouth at bedtime. 07/18/21   [provider]    Family History Family History  Problem Relation Age of Onset   Diabetes Maternal Grandfather    Cancer Paternal Grandfather    Cancer Paternal Grandmother     Social History Social History   Tobacco Use   Smoking status: Never    Passive exposure: Yes   Smokeless tobacco: Never   Tobacco comments:    mom smokes outside  Substance Use Topics   Alcohol use: Never   Drug use: Never     Allergies   Patient has no known allergies.   Review of Systems Review of Systems as listed above in HPI   Physical Exam Triage Vital Signs ED Triage Vitals  Enc Vitals Group     BP 03/15/22 1320 106/73     Pulse Rate 03/15/22 1320 91     Resp 03/15/22 1320 18     Temp 03/15/22 1320 98.3 F (36.8 C)     Temp Source 03/15/22 1320 Oral     SpO2 03/15/22 1320 98 %     Weight 03/15/22 1321 140 lb 14 oz (63.9 kg)     Height 03/15/22 1321 '5\' 4"'$  (1.626 m)  Head Circumference --      Peak Flow --      Pain Score 03/15/22 1321 7     Pain Loc --      Pain Edu? --      Excl. in [place]? --    No data found.  Updated Vital Signs BP 106/73 (BP Location: Right Arm)   Pulse 91   Temp 98.3 F (36.8 C) (Oral)   Resp 18   Ht '5\' 4"'$  (1.626 m)   Wt 63.9 kg   LMP 02/12/2022   SpO2 98%   BMI 24.18 kg/m   Physical Exam Vitals reviewed.  Constitutional:      General: She is not in acute distress.    Appearance: Normal appearance. She is not ill-appearing, toxic-appearing or diaphoretic.  Cardiovascular:     Rate and Rhythm: Normal rate.  Pulmonary:     Effort: Pulmonary effort is normal.  Musculoskeletal:     Comments: Right foot: No obvious deformity or asymmetry.  She has some very slight erythema around the medial nailbed of her great toe.  Some tenderness to palpation over that area.  No tenderness to palpation laterally or along the dorsum of the phalanx.  Full range of  motion with flexion and extension.  Some slightly decreased strength with great toe flexion secondary to pain.  No drainage from the area.  Skin:    General: Skin is warm.  Neurological:     Mental Status: She is alert.      UC Treatments / Results  Labs (all labs ordered are listed, but only abnormal results are displayed) Labs Reviewed - No data to display  EKG   Radiology No results found.  Procedures Procedures (including critical care time)  Medications Ordered in UC Medications  lidocaine (XYLOCAINE) 5 % ointment (has no administration in time range)    Initial Impression / Assessment and Plan / UC Course  I have reviewed the triage vital signs and the nursing notes.  Pertinent labs & imaging results that were available during my care of the patient were reviewed by me and considered in my medical decision making (see chart for details).     Ingrown toenail Currently no signs of infection.  Topical LET gel was applied and her toe was wrapped today.  I sent to her pharmacy prescription for 800 mg ibuprofen to help with her pain, topical lidocaine gel and topical steroid to help with the inflammation.  I also sent her with a referral to follow-up with podiatry or the foot and ankle physician.  She verbalized understanding.  Recommend ambulating in a hard soled shoe and continued warm water soaks 10 to 20 minutes a couple times a day.  She verbalized understanding. Return to urgent care if symptoms worsen or fail to improve Final Clinical Impressions(s) / UC Diagnoses   Final diagnoses:  None     Discharge Instructions      Your toe pain is likely secondary to some irritation of the nailbed.  It does not appear infected at today's visit.  I would recommend continue with warm Epsom salt soaks 10 to 20 minutes at a time a couple times a day.  I have also sent to your pharmacy some oral ibuprofen that you can take up to 3 times a day for pain.  I have also sent some  lidocaine, numbing, ointment and triamcinolone, steroid cream that can help with the swelling and pain.  Recommend follow-up with podiatry or foot and ankle  doctor if your symptoms worsen or fail to improve.   ED Prescriptions     Medication Sig Dispense Auth. Provider   triamcinolone ointment (KENALOG) 0.5 % Apply 1 Application topically 2 (two) times daily. 30 g Rodena Goldmann A, DO   ibuprofen (ADVIL) 800 MG tablet Take 1 tablet (800 mg total) by mouth 3 (three) times daily. 21 tablet Sherl Yzaguirre A, DO   lidocaine (XYLOCAINE) 5 % ointment Apply 1 Application topically as needed. 35.44 g Rodena Goldmann A, DO      PDMP not reviewed this encounter.   Elmore Guise, DO 03/15/22 1359

## 2022-03-15 NOTE — ED Triage Notes (Signed)
Pt c/o swollen and pain on her left great toe nail since yesterday getting more painful to walk.

## 2022-03-15 NOTE — ED Notes (Signed)
1 ml let gel applied to left great toe. Wrapped with non stick gauze and coban.

## 2022-03-21 DIAGNOSIS — M79675 Pain in left toe(s): Secondary | ICD-10-CM | POA: Diagnosis not present

## 2022-09-26 ENCOUNTER — Ambulatory Visit: Payer: Medicaid Other | Admitting: Family Medicine

## 2022-10-18 ENCOUNTER — Ambulatory Visit (INDEPENDENT_AMBULATORY_CARE_PROVIDER_SITE_OTHER): Payer: Medicaid Other | Admitting: Family Medicine

## 2022-10-18 ENCOUNTER — Encounter: Payer: Self-pay | Admitting: Family Medicine

## 2022-10-18 VITALS — BP 105/71 | HR 94 | Temp 98.0°F | Resp 16 | Ht 65.0 in | Wt 142.6 lb

## 2022-10-18 DIAGNOSIS — Z1159 Encounter for screening for other viral diseases: Secondary | ICD-10-CM | POA: Diagnosis not present

## 2022-10-18 DIAGNOSIS — Z0000 Encounter for general adult medical examination without abnormal findings: Secondary | ICD-10-CM | POA: Diagnosis not present

## 2022-10-18 DIAGNOSIS — Z13220 Encounter for screening for lipoid disorders: Secondary | ICD-10-CM | POA: Diagnosis not present

## 2022-10-18 DIAGNOSIS — Z130 Encounter for screening for diseases of the blood and blood-forming organs and certain disorders involving the immune mechanism: Secondary | ICD-10-CM | POA: Diagnosis not present

## 2022-10-18 NOTE — Progress Notes (Signed)
Established Patient Office Visit  Subjective    Patient ID: Erin Strickland, female    DOB: 01/06/2000  Age: 22 y.o. MRN: [id number]  CC:  Chief Complaint  Patient presents with   Annual Exam    HPI Shawntelle Ungar presents for annual exam. Denies acute complaints.   Outpatient Encounter Medications as of 10/18/2022  Medication Sig   ibuprofen (ADVIL) 800 MG tablet Take 1 tablet (800 mg total) by mouth 3 (three) times daily.   baclofen (LIORESAL) 10 MG tablet Take by mouth as needed. (Patient not taking: Reported on 10/18/2022)   lidocaine (XYLOCAINE) 5 % ointment Apply 1 Application topically as needed. (Patient not taking: Reported on 10/18/2022)   SUMAtriptan (IMITREX) 25 MG tablet Take 1 tablet (25 mg total) by mouth every 2 (two) hours as needed for migraine. May repeat in 2 hours if headache persists or recurs. (Patient not taking: Reported on 10/18/2022)   triamcinolone ointment (KENALOG) 0.5 % Apply 1 Application topically 2 (two) times daily. (Patient not taking: Reported on 10/18/2022)   zonisamide (ZONEGRAN) 25 MG capsule Take 100 mg by mouth at bedtime. (Patient not taking: Reported on 10/18/2022)   No facility-administered encounter medications on file as of 10/18/2022.    Past Medical History:  Diagnosis Date   GERD (gastroesophageal reflux disease)     History reviewed. No pertinent surgical history.  Family History  Problem Relation Age of Onset   Diabetes Maternal Grandfather    Cancer Paternal Grandfather    Cancer Paternal Grandmother     Social History   Socioeconomic History   Marital status: Significant Other    Spouse name: Not on file   Number of children: Not on file   Years of education: Not on file   Highest education level: GED or equivalent  Occupational History   Not on file  Tobacco Use   Smoking status: Never    Passive exposure: Yes   Smokeless tobacco: Never   Tobacco comments:    mom smokes outside  Substance and Sexual  Activity   Alcohol use: Never   Drug use: Never   Sexual activity: Yes    Birth control/protection: Injection    Comment: DEPO Shot  Other Topics Concern   Not on file  Social History Narrative   Not on file   Social Determinants of Health   Financial Resource Strain: Low Risk  (10/17/2022)   Overall Financial Resource Strain (CARDIA)    Difficulty of Paying Living Expenses: Not very hard  Food Insecurity: No Food Insecurity (10/17/2022)   Hunger Vital Sign    Worried About Running Out of Food in the Last Year: Never true    Ran Out of Food in the Last Year: Never true  Transportation Needs: No Transportation Needs (10/17/2022)   PRAPARE - Administrator, Civil Service (Medical): No    Lack of Transportation (Non-Medical): No  Physical Activity: Insufficiently Active (10/17/2022)   Exercise Vital Sign    Days of Exercise per Week: 3 days    Minutes of Exercise per Session: 20 min  Stress: No Stress Concern Present (10/17/2022)   [employer] of Occupational Health - Occupational Stress Questionnaire    Feeling of Stress : Not at all  Social Connections: Unknown (10/17/2022)   Social Connection and Isolation Panel [NHANES]    Frequency of Communication with Friends and Family: More than three times a week    Frequency of Social Gatherings with Friends and Family: Twice a week  Attends Religious Services: Patient declined    Active Member of Clubs or Organizations: No    Attends Banker Meetings: Not on file    Marital Status: Living with partner  Intimate Partner Violence: Not on file    Review of Systems  All other systems reviewed and are negative.       Objective    BP 105/71 (BP Location: Left Arm, Patient Position: Sitting, Cuff Size: Normal)   Pulse 94   Temp 98 F (36.7 C) (Oral)   Resp 16   Ht 5\' 5"  (1.651 m)   Wt 142 lb 9.6 oz (64.7 kg)   SpO2 96%   BMI 23.73 kg/m   Physical Exam Vitals and nursing note reviewed.   Constitutional:      General: She is not in acute distress. HENT:     Head: Normocephalic and atraumatic.     Right Ear: Tympanic membrane, ear canal and external ear normal.     Left Ear: Tympanic membrane, ear canal and external ear normal.     Nose: Nose normal.     Mouth/Throat:     Mouth: Mucous membranes are moist.     Pharynx: Oropharynx is clear.  Eyes:     Conjunctiva/sclera: Conjunctivae normal.     Pupils: Pupils are equal, round, and reactive to light.  Neck:     Thyroid: No thyromegaly.  Cardiovascular:     Rate and Rhythm: Normal rate and regular rhythm.     Heart sounds: Normal heart sounds. No murmur heard. Pulmonary:     Effort: Pulmonary effort is normal. No respiratory distress.     Breath sounds: Normal breath sounds.  Abdominal:     General: There is no distension.     Palpations: Abdomen is soft. There is no mass.     Tenderness: There is no abdominal tenderness.  Musculoskeletal:        General: Normal range of motion.     Cervical back: Normal range of motion and neck supple.  Skin:    General: Skin is warm and dry.  Neurological:     General: No focal deficit present.     Mental Status: She is alert and oriented to person, place, and time.  Psychiatric:        Mood and Affect: Mood normal.        Behavior: Behavior normal.         Assessment & Plan:   1. Annual physical exam  - CMP14+EGFR  2. Screening for deficiency anemia  - CBC with Differential  3. Screening for lipid disorders  - Lipid Panel  4. Need for hepatitis C screening test  - Hepatitis C Antibody   No follow-ups on file.   Tommie Raymond, MD

## 2022-10-19 LAB — CBC WITH DIFFERENTIAL/PLATELET
Basophils Absolute: 0.1 10*3/uL (ref 0.0–0.2)
Basos: 1 %
EOS (ABSOLUTE): 0.3 10*3/uL (ref 0.0–0.4)
Eos: 3 %
Hematocrit: 46.8 % — ABNORMAL HIGH (ref 34.0–46.6)
Hemoglobin: 15.9 g/dL (ref 11.1–15.9)
Immature Grans (Abs): 0 10*3/uL (ref 0.0–0.1)
Immature Granulocytes: 0 %
Lymphocytes Absolute: 4.2 10*3/uL — ABNORMAL HIGH (ref 0.7–3.1)
Lymphs: 48 %
MCH: 30.5 pg (ref 26.6–33.0)
MCHC: 34 g/dL (ref 31.5–35.7)
MCV: 90 fL (ref 79–97)
Monocytes Absolute: 0.7 10*3/uL (ref 0.1–0.9)
Monocytes: 8 %
Neutrophils Absolute: 3.5 10*3/uL (ref 1.4–7.0)
Neutrophils: 40 %
Platelets: 302 10*3/uL (ref 150–450)
RBC: 5.21 x10E6/uL (ref 3.77–5.28)
RDW: 12.2 % (ref 11.7–15.4)
WBC: 8.7 10*3/uL (ref 3.4–10.8)

## 2022-10-19 LAB — CMP14+EGFR
ALT: 12 [IU]/L (ref 0–32)
AST: 18 [IU]/L (ref 0–40)
Albumin: 4.7 g/dL (ref 4.0–5.0)
Alkaline Phosphatase: 121 [IU]/L (ref 44–121)
BUN/Creatinine Ratio: 20 (ref 9–23)
BUN: 12 mg/dL (ref 6–20)
Bilirubin Total: 0.3 mg/dL (ref 0.0–1.2)
CO2: 23 mmol/L (ref 20–29)
Calcium: 9.7 mg/dL (ref 8.7–10.2)
Chloride: 102 mmol/L (ref 96–106)
Creatinine, Ser: 0.6 mg/dL (ref 0.57–1.00)
Globulin, Total: 2.6 g/dL (ref 1.5–4.5)
Glucose: 91 mg/dL (ref 70–99)
Potassium: 4.9 mmol/L (ref 3.5–5.2)
Sodium: 142 mmol/L (ref 134–144)
Total Protein: 7.3 g/dL (ref 6.0–8.5)
eGFR: 131 mL/min/{1.73_m2} (ref >59)

## 2022-10-19 LAB — LIPID PANEL
Chol/HDL Ratio: 2.5 {ratio} (ref 0.0–4.4)
Cholesterol, Total: 151 mg/dL (ref 100–199)
HDL: 60 mg/dL (ref >39)
LDL Chol Calc (NIH): 78 mg/dL (ref 0–99)
Triglycerides: 68 mg/dL (ref 0–149)
VLDL Cholesterol Cal: 13 mg/dL (ref 5–40)

## 2022-10-19 LAB — HEPATITIS C ANTIBODY: Hep C Virus Ab: NONREACTIVE

## 2022-12-07 ENCOUNTER — Ambulatory Visit: Payer: Medicaid Other | Admitting: Adult Health

## 2022-12-07 ENCOUNTER — Encounter: Payer: Self-pay | Admitting: Adult Health

## 2022-12-07 VITALS — BP 122/76 | HR 104 | Ht 65.0 in | Wt 143.5 lb

## 2022-12-07 DIAGNOSIS — Z3201 Encounter for pregnancy test, result positive: Secondary | ICD-10-CM

## 2022-12-07 DIAGNOSIS — Z1332 Encounter for screening for maternal depression: Secondary | ICD-10-CM | POA: Diagnosis not present

## 2022-12-07 DIAGNOSIS — O3680X0 Pregnancy with inconclusive fetal viability, not applicable or unspecified: Secondary | ICD-10-CM

## 2022-12-07 DIAGNOSIS — Z3A01 Less than 8 weeks gestation of pregnancy: Secondary | ICD-10-CM

## 2022-12-07 HISTORY — DX: Pregnancy with inconclusive fetal viability, not applicable or unspecified: O36.80X0

## 2022-12-07 LAB — POCT URINE PREGNANCY: Preg Test, Ur: POSITIVE — AB

## 2022-12-07 MED ORDER — PRENATAL PLUS 27-1 MG PO TABS
1.0000 | ORAL_TABLET | Freq: Every day | ORAL | 12 refills | Status: AC
Start: 2022-12-07 — End: ?

## 2022-12-07 NOTE — Progress Notes (Signed)
Subjective:     Patient ID: Erin Strickland, female   DOB: 06/13/2000, 22 y.o.   MRN: [id number]  HPI Erin Strickland is a 22 year old female, married,G1P0 in for pregnancy confirmation, has missed period and had 2+HPTs. She has had some cramping, no bleeding. She lives in [place].   PCP is Dr Andrey Campanile.  Review of Systems +missed period with +HPT x 2 +cramping no bleeding Reviewed past medical,surgical, social and family history. Reviewed medications and allergies.     Objective:   Physical Exam BP 122/76 (BP Location: Left Arm, Patient Position: Sitting, Cuff Size: Normal)   Pulse (!) 104   Ht 5\' 5"  (1.651 m)   Wt 143 lb 8 oz (65.1 kg)   LMP 11/04/2022   BMI 23.88 kg/m  UPT is positive,about 4+5 weeks by LMP with EDD 08/11/23 Skin warm and dry. Neck: mid line trachea, normal thyroid, good ROM, no lymphadenopathy noted. Lungs: clear to ausculation bilaterally. Cardiovascular: regular rate and rhythm.    Abdomen is soft and non tender AA is 1 Fall risk is low    12/07/2022   10:12 AM 10/18/2022   10:05 AM 11/16/2020    8:59 AM  Depression screen PHQ 2/9  Decreased Interest 1 0   Down, Depressed, Hopeless 0 0 1  PHQ - 2 Score 1 0 1  Altered sleeping 2  1  Tired, decreased energy 1    Change in appetite 0    Feeling bad or failure about yourself  0    Trouble concentrating 0    Moving slowly or fidgety/restless 0    Suicidal thoughts 0    PHQ-9 Score 4  2  Difficult doing work/chores   Not difficult at all       12/07/2022   10:12 AM 10/18/2022   10:05 AM  GAD 7 : Generalized Anxiety Score  Nervous, Anxious, on Edge 1 0  Control/stop worrying 0 0  Worry too much - different things 0 0  Trouble relaxing 0 0  Restless 0 0  Easily annoyed or irritable 0 0  Afraid - awful might happen 0 0  Total GAD 7 Score 1 0  Anxiety Difficulty  Not difficult at all      Upstream - 12/07/22 1020       Pregnancy Intention Screening   Does the patient want to become pregnant in the  next year? N/A    Does the patient's partner want to become pregnant in the next year? N/A    Would the patient like to discuss contraceptive options today? No      Contraception Wrap Up   Current Method Pregnant/Seeking Pregnancy    End Method Pregnant/Seeking Pregnancy    Contraception Counseling Provided No             Assessment:     1. Pregnancy examination or test, positive result - POCT urine pregnancy  2. Less than [redacted] weeks gestation of pregnancy Rx PNV Meds ordered this encounter  Medications   prenatal vitamin w/FE, FA (PRENATAL 1 + 1) 27-1 MG TABS tablet    Sig: Take 1 tablet by mouth daily at 12 noon.    Dispense:  30 tablet    Refill:  12    Order Specific Question:   Supervising Provider    Answer:   Duane Lope H [[id number]]   No heavy lifting, works at [employer]  3. Encounter to determine fetal viability of pregnancy, single or unspecified fetus Dating [place] in about  [facility] Comp Less 14 Wks; Future     Plan:    To be seen at [facility], lives in [place] to her home. Review OB packet

## 2022-12-14 ENCOUNTER — Inpatient Hospital Stay (HOSPITAL_COMMUNITY)
Admission: AD | Admit: 2022-12-14 | Discharge: 2022-12-14 | Disposition: A | Discharge status: Home / Self Care | Payer: Medicaid Other | Attending: Obstetrics and Gynecology | Admitting: Obstetrics and Gynecology

## 2022-12-14 ENCOUNTER — Inpatient Hospital Stay (HOSPITAL_COMMUNITY): Payer: Medicaid Other

## 2022-12-14 ENCOUNTER — Encounter (HOSPITAL_COMMUNITY): Payer: Self-pay

## 2022-12-14 DIAGNOSIS — O26891 Other specified pregnancy related conditions, first trimester: Secondary | ICD-10-CM | POA: Insufficient documentation

## 2022-12-14 DIAGNOSIS — Z711 Person with feared health complaint in whom no diagnosis is made: Secondary | ICD-10-CM

## 2022-12-14 DIAGNOSIS — R1030 Lower abdominal pain, unspecified: Secondary | ICD-10-CM | POA: Insufficient documentation

## 2022-12-14 DIAGNOSIS — R109 Unspecified abdominal pain: Secondary | ICD-10-CM

## 2022-12-14 DIAGNOSIS — Z3A01 Less than 8 weeks gestation of pregnancy: Secondary | ICD-10-CM | POA: Diagnosis not present

## 2022-12-14 DIAGNOSIS — Z3201 Encounter for pregnancy test, result positive: Secondary | ICD-10-CM | POA: Diagnosis not present

## 2022-12-14 DIAGNOSIS — Z3A00 Weeks of gestation of pregnancy not specified: Secondary | ICD-10-CM | POA: Diagnosis not present

## 2022-12-14 LAB — CBC
HCT: 41.2 % (ref 36.0–46.0)
Hemoglobin: 14.6 g/dL (ref 12.0–15.0)
MCH: 30.5 pg (ref 26.0–34.0)
MCHC: 35.4 g/dL (ref 30.0–36.0)
MCV: 86.2 fL (ref 80.0–100.0)
Platelets: 260 10*3/uL (ref 150–400)
RBC: 4.78 MIL/uL (ref 3.87–5.11)
RDW: 12.3 % (ref 11.5–15.5)
WBC: 9.9 10*3/uL (ref 4.0–10.5)
nRBC: 0 % (ref 0.0–0.2)

## 2022-12-14 LAB — URINALYSIS, ROUTINE W REFLEX MICROSCOPIC
Bilirubin Urine: NEGATIVE
Glucose, UA: NEGATIVE mg/dL
Hgb urine dipstick: NEGATIVE
Ketones, ur: 20 mg/dL — AB
Leukocytes,Ua: NEGATIVE
Nitrite: NEGATIVE
Protein, ur: NEGATIVE mg/dL
Specific Gravity, Urine: 1.025 (ref 1.005–1.030)
pH: 6 (ref 5.0–8.0)

## 2022-12-14 LAB — WET PREP, GENITAL
Clue Cells Wet Prep HPF POC: NONE SEEN
Sperm: NONE SEEN
Trich, Wet Prep: NONE SEEN
WBC, Wet Prep HPF POC: <10 (ref <10)
Yeast Wet Prep HPF POC: NONE SEEN

## 2022-12-14 LAB — HCG, QUANTITATIVE, PREGNANCY: hCG, Beta Chain, Quant, S: 20652 m[IU]/mL — ABNORMAL HIGH (ref <5)

## 2022-12-14 NOTE — MAU Provider Note (Signed)
[place]     CSN: [id number]  Arrival date and time: 12/14/22 1816   Event Date/Time   First Provider Initiated Contact with Patient 12/14/22 2010      Chief Complaint  Patient presents with   Abdominal Pain   Erin Strickland , a  22 y.o. G1P0 at [redacted]w[redacted]d presents to MAU with complaints of lower abdominal cramping. Patient reports that this is an on-going problem for the last 1.5 weeks. She states that she attempts to relieve with Tylenol but reports that it does not help. She reports that the pain is all over the abdomen and not localized to 1 side of another. She denies abnormal vaginal discharge or vaginal bleeding. Currently rating pain a 6/10.          OB History     Gravida  1   Para      Term      Preterm      AB      Living         SAB      IAB      Ectopic      Multiple      Live Births              Past Medical History:  Diagnosis Date   Encounter to determine fetal viability of pregnancy 12/07/2022   GERD (gastroesophageal reflux disease)     No past surgical history on file.  Family History  Problem Relation Age of Onset   Cancer Paternal Grandfather    Cancer Paternal Grandmother    Diabetes Maternal Grandmother     Social History   Tobacco Use   Smoking status: Never    Passive exposure: Yes   Smokeless tobacco: Never   Tobacco comments:    mom smokes outside  Vaping Use   Vaping status: Never Used  Substance Use Topics   Alcohol use: Not Currently   Drug use: Never    Allergies: No Known Allergies  Medications Prior to Admission  Medication Sig Dispense Refill Last Dose/Taking   prenatal vitamin w/FE, FA (PRENATAL 1 + 1) 27-1 MG TABS tablet Take 1 tablet by mouth daily at 12 noon. 30 tablet 12     Review of Systems  Constitutional:  Negative for chills, fatigue and fever.  Eyes:  Negative for pain and visual disturbance.  Respiratory:  Negative for apnea, shortness of breath and wheezing.   Cardiovascular:   Negative for chest pain and palpitations.  Gastrointestinal:  Negative for abdominal pain, constipation, diarrhea, nausea and vomiting.  Genitourinary:  Negative for difficulty urinating, dysuria, pelvic pain, vaginal bleeding, vaginal discharge and vaginal pain.  Musculoskeletal:  Negative for back pain.  Neurological:  Negative for seizures, weakness and headaches.  Psychiatric/Behavioral:  Negative for suicidal ideas.    Physical Exam   Blood pressure (!) 113/57, pulse 88, temperature 98 F (36.7 C), temperature source Oral, resp. rate 16, height 5\' 5"  (1.651 m), weight 64.5 kg, last menstrual period 11/04/2022, SpO2 100%.  Physical Exam Vitals and nursing note reviewed.  Constitutional:      General: She is not in acute distress.    Appearance: Normal appearance.  HENT:     Head: Normocephalic.  Pulmonary:     Effort: Pulmonary effort is normal.  Musculoskeletal:     Cervical back: Normal range of motion.  Skin:    General: Skin is warm and dry.  Neurological:     Mental Status: She is alert and oriented to  person, place, and time.  Psychiatric:        Mood and Affect: Mood normal.     MAU Course  Procedures Orders Placed This Encounter  Procedures   Wet prep, genital   US OB LESS THAN 14 WEEKS WITH OB TRANSVAGINAL   Urinalysis, Routine w reflex microscopic -Urine, Clean Catch   CBC   hCG, quantitative, pregnancy   Diet NPO time specified   ABO/Rh   Discharge patient   Results for orders placed or performed during the hospital encounter of 12/14/22 (from the past 24 hours)  ABO/Rh     Status: None   Collection Time: 12/14/22  7:04 PM  Result Value Ref Range   ABO/RH(D)      O POS Performed at [facility], [address] [place], [place] [address]   Urinalysis, Routine w reflex microscopic -Urine, Clean Catch     Status: Abnormal   Collection Time: 12/14/22  7:06 PM  Result Value Ref Range   Color, Urine YELLOW YELLOW   APPearance CLEAR CLEAR    Specific Gravity, Urine 1.025 1.005 - 1.030   pH 6.0 5.0 - 8.0   Glucose, UA NEGATIVE NEGATIVE mg/dL   Hgb urine dipstick NEGATIVE NEGATIVE   Bilirubin Urine NEGATIVE NEGATIVE   Ketones, ur 20 (A) NEGATIVE mg/dL   Protein, ur NEGATIVE NEGATIVE mg/dL   Nitrite NEGATIVE NEGATIVE   Leukocytes,Ua NEGATIVE NEGATIVE   RBC / HPF 0-5 0 - 5 RBC/hpf   WBC, UA 0-5 0 - 5 WBC/hpf   Bacteria, UA RARE (A) NONE SEEN   Squamous Epithelial / HPF 0-5 0 - 5 /HPF   Mucus PRESENT   CBC     Status: None   Collection Time: 12/14/22  7:06 PM  Result Value Ref Range   WBC 9.9 4.0 - 10.5 K/uL   RBC 4.78 3.87 - 5.11 MIL/uL   Hemoglobin 14.6 12.0 - 15.0 g/dL   HCT 60.4 54.0 - 98.1 %   MCV 86.2 80.0 - 100.0 fL   MCH 30.5 26.0 - 34.0 pg   MCHC 35.4 30.0 - 36.0 g/dL   RDW 19.1 47.8 - 29.5 %   Platelets 260 150 - 400 K/uL   nRBC 0.0 0.0 - 0.2 %  hCG, quantitative, pregnancy     Status: Abnormal   Collection Time: 12/14/22  7:06 PM  Result Value Ref Range   hCG, Beta Chain, Quant, S 20,652 (H) <5 mIU/mL  Wet prep, genital     Status: None   Collection Time: 12/14/22  7:41 PM   Specimen: PATH Cytology Cervicovaginal Ancillary Only  Result Value Ref Range   Yeast Wet Prep HPF POC NONE SEEN NONE SEEN   Trich, Wet Prep NONE SEEN NONE SEEN   Clue Cells Wet Prep HPF POC NONE SEEN NONE SEEN   WBC, Wet Prep HPF POC <10 <10   Sperm NONE SEEN     MDM - Wet prep normal  - UA Ketones 20 and bacteria rare. UA reflexed for culture  - CBC normal.  - Hcg > 20K  - CNM independently reviewed [place] results and noted a single IUP containing GS and YS consistent with LMP for dating.  - Plan for discharge.   Assessment and Plan   1. Abdominal cramping   2. Physically well but worried   3. [redacted] weeks gestation of pregnancy    - Reviewed that abdominal cramping can be a normal discomfort of pregnancy.  - Reviewed worsening signs  and return precautions.  - Recommended to establish care at a OBGYN facility of her choosing.  List of providers given,  - Patient discharged home in stable condition and may return to MAU as needed.   Claudette Head, MSN CNM  12/14/2022, 8:10 PM

## 2022-12-14 NOTE — MAU Note (Signed)
.  Erin Strickland is a 22 y.o. at [redacted]w[redacted]d here in MAU reporting: Occasional lower abdominal sharp pains for one week. Denies VB. Denies vaginal itching, vaginal odors, and vaginal discharge. Has not had an [place].  Last dose of Tylenol around 1520  Onset of complaint: one week Pain score: 6/10 lower abdomen  Vitals:   12/14/22 1841  BP: (!) 113/57  Pulse: 88  Resp: 16  Temp: 98 F (36.7 C)  SpO2: 100%     FHT: n/a Lab orders placed from triage: UA

## 2022-12-14 NOTE — Discharge Instructions (Signed)
Prenatal Care Providers           [facility]  [address] [phone]  [facility]   [address]  [phone]  [facility]       [address] [phone]            [facility] @ [place]     [address] [phone]          [facility] @ [place]   [address] [phone]  [facility]  [address] [phone]     [facility])  [address]   [phone]     [facility]  Phone: [phone]  [facility]  Phone: [phone]  [facility] Phone: [phone]  [facility] Phone: [phone]  [facility] Phone: [phone]  [facility] Phone: [phone]  [facility]  Phone: [phone]  

## 2022-12-15 LAB — ABO/RH: ABO/RH(D): O POS

## 2022-12-16 ENCOUNTER — Encounter: Payer: Self-pay | Admitting: Family Medicine

## 2022-12-16 LAB — CULTURE, OB URINE: Culture: NO GROWTH

## 2022-12-17 LAB — GC/CHLAMYDIA PROBE AMP (~~LOC~~) NOT AT ARMC
Chlamydia: NEGATIVE
Comment: NEGATIVE
Comment: NORMAL
Neisseria Gonorrhea: NEGATIVE

## 2022-12-19 ENCOUNTER — Encounter (HOSPITAL_COMMUNITY): Payer: Self-pay | Admitting: Obstetrics & Gynecology

## 2022-12-19 ENCOUNTER — Inpatient Hospital Stay (HOSPITAL_COMMUNITY)
Admission: AD | Admit: 2022-12-19 | Discharge: 2022-12-19 | Disposition: A | Discharge status: Home / Self Care | Payer: Medicaid Other | Attending: Obstetrics & Gynecology | Admitting: Obstetrics & Gynecology

## 2022-12-19 DIAGNOSIS — Z3A01 Less than 8 weeks gestation of pregnancy: Secondary | ICD-10-CM | POA: Diagnosis not present

## 2022-12-19 DIAGNOSIS — O209 Hemorrhage in early pregnancy, unspecified: Secondary | ICD-10-CM

## 2022-12-19 DIAGNOSIS — R109 Unspecified abdominal pain: Secondary | ICD-10-CM | POA: Diagnosis present

## 2022-12-19 HISTORY — DX: Headache, unspecified: R51.9

## 2022-12-19 HISTORY — DX: Unspecified ovarian cyst, unspecified side: N83.209

## 2022-12-19 LAB — WET PREP, GENITAL
Clue Cells Wet Prep HPF POC: NONE SEEN
Sperm: NONE SEEN
Trich, Wet Prep: NONE SEEN
WBC, Wet Prep HPF POC: <10 (ref <10)
Yeast Wet Prep HPF POC: NONE SEEN

## 2022-12-19 LAB — URINALYSIS, ROUTINE W REFLEX MICROSCOPIC
Bacteria, UA: NONE SEEN
Bilirubin Urine: NEGATIVE
Glucose, UA: NEGATIVE mg/dL
Ketones, ur: 20 mg/dL — AB
Leukocytes,Ua: NEGATIVE
Nitrite: NEGATIVE
Protein, ur: NEGATIVE mg/dL
Specific Gravity, Urine: 1.011 (ref 1.005–1.030)
pH: 5 (ref 5.0–8.0)

## 2022-12-19 NOTE — MAU Note (Signed)
Erin Strickland is a 22 y.o. at [redacted]w[redacted]d here in MAU reporting: was at work, went to use the restroom and saw blood when she wiped(bright red). Spotted a couple days ago.  Having on going cramping.   Onset of complaint: this morning Pain score: mild, lower abd Vitals:   12/19/22 1110  BP: 100/79  Pulse: 78  Resp: 15  Temp: 98.1 F (36.7 C)  SpO2: 100%      Lab orders placed from triage:

## 2022-12-19 NOTE — MAU Provider Note (Signed)
Chief Complaint: Vaginal Bleeding and Abdominal Pain   Event Date/Time   First Provider Initiated Contact with Patient 12/19/22 1221      SUBJECTIVE HPI: Erin Strickland is a 22 y.o. G1P0 at [redacted]w[redacted]d by LMP who presents to maternity admissions reporting vaginal bleeding, cramping.  Patient has had mild lower abdominal cramping. Like a period cramp she says. Ongoing for past ~week. Had spotting when she wiped this morning and so sought care. Last seen in MAU 12/13 for similar complaints. Was found to have IUP at that time. Denies change in discharge otherwise, LOF, passage of clots/tissue, fever/chills.   Has follow-up US Monday for viability and new OB appointment next Friday.   HPI  Past Medical History:  Diagnosis Date   Encounter to determine fetal viability of pregnancy 12/07/2022   GERD (gastroesophageal reflux disease)    Headache    Ovarian cyst    Past Surgical History:  Procedure Laterality Date   NO PAST SURGERIES     Social History   Socioeconomic History   Marital status: Married    Spouse name: Not on file   Number of children: Not on file   Years of education: Not on file   Highest education level: GED or equivalent  Occupational History   Not on file  Tobacco Use   Smoking status: Never    Passive exposure: Yes   Smokeless tobacco: Never   Tobacco comments:    mom smokes outside  Vaping Use   Vaping status: Never Used  Substance and Sexual Activity   Alcohol use: Not Currently   Drug use: Never   Sexual activity: Yes    Birth control/protection: None  Other Topics Concern   Not on file  Social History Narrative   Not on file   Social Drivers of Health   Financial Resource Strain: Low Risk  (12/07/2022)   Overall Financial Resource Strain (CARDIA)    Difficulty of Paying Living Expenses: Not hard at all  Food Insecurity: No Food Insecurity (12/07/2022)   Hunger Vital Sign    Worried About Running Out of Food in the Last Year: Never true    Ran Out  of Food in the Last Year: Never true  Transportation Needs: No Transportation Needs (12/07/2022)   PRAPARE - Administrator, Civil Service (Medical): No    Lack of Transportation (Non-Medical): No  Physical Activity: Inactive (12/07/2022)   Exercise Vital Sign    Days of Exercise per Week: 0 days    Minutes of Exercise per Session: 0 min  Stress: No Stress Concern Present (12/07/2022)   [employer] of Occupational Health - Occupational Stress Questionnaire    Feeling of Stress : Only a little  Social Connections: Moderately Integrated (12/07/2022)   Social Connection and Isolation Panel [NHANES]    Frequency of Communication with Friends and Family: More than three times a week    Frequency of Social Gatherings with Friends and Family: Once a week    Attends Religious Services: More than 4 times per year    Active Member of [employer] or Organizations: No    Attends Banker Meetings: Never    Marital Status: Married  Catering manager Violence: Not At Risk (12/07/2022)   Humiliation, Afraid, Rape, and Kick questionnaire    Fear of Current or Ex-Partner: No    Emotionally Abused: No    Physically Abused: No    Sexually Abused: No   No current facility-administered medications on file prior to  encounter.   Current Outpatient Medications on File Prior to Encounter  Medication Sig Dispense Refill   acetaminophen (TYLENOL) 500 MG tablet Take 1,000 mg by mouth every 6 (six) hours as needed.     prenatal vitamin w/FE, FA (PRENATAL 1 + 1) 27-1 MG TABS tablet Take 1 tablet by mouth daily at 12 noon. 30 tablet 12   No Known Allergies  ROS:  Pertinent positives/negatives listed above.  I have reviewed patient's Past Medical Hx, Surgical Hx, Family Hx, Social Hx, medications and allergies.   Physical Exam  Patient Vitals for the past 24 hrs:  BP Temp Temp src Pulse Resp SpO2 Height Weight  12/19/22 1110 100/79 98.1 F (36.7 C) Oral 78 15 100 % 5\' 5"  (1.651 m) 64 kg    Constitutional: Well-developed, well-nourished female in no acute distress Cardiovascular: normal rate Respiratory: normal effort GI: Abd soft, non-tender. Pos BS x 4 MS: Extremities nontender, no edema, normal ROM Neurologic: Alert and oriented x 4.   LAB RESULTS Results for orders placed or performed during the hospital encounter of 12/19/22 (from the past 24 hours)  Urinalysis, Routine w reflex microscopic -Urine, Clean Catch     Status: Abnormal   Collection Time: 12/19/22 11:27 AM  Result Value Ref Range   Color, Urine YELLOW YELLOW   APPearance CLEAR CLEAR   Specific Gravity, Urine 1.011 1.005 - 1.030   pH 5.0 5.0 - 8.0   Glucose, UA NEGATIVE NEGATIVE mg/dL   Hgb urine dipstick MODERATE (A) NEGATIVE   Bilirubin Urine NEGATIVE NEGATIVE   Ketones, ur 20 (A) NEGATIVE mg/dL   Protein, ur NEGATIVE NEGATIVE mg/dL   Nitrite NEGATIVE NEGATIVE   Leukocytes,Ua NEGATIVE NEGATIVE   RBC / HPF 0-5 0 - 5 RBC/hpf   WBC, UA 0-5 0 - 5 WBC/hpf   Bacteria, UA NONE SEEN NONE SEEN   Squamous Epithelial / HPF 0-5 0 - 5 /HPF   Mucus PRESENT   Wet prep, genital     Status: None   Collection Time: 12/19/22 11:27 AM   Specimen: Urine, Clean Catch  Result Value Ref Range   Yeast Wet Prep HPF POC NONE SEEN NONE SEEN   Trich, Wet Prep NONE SEEN NONE SEEN   Clue Cells Wet Prep HPF POC NONE SEEN NONE SEEN   WBC, Wet Prep HPF POC <10 <10   Sperm NONE SEEN     --/--/O POS (12/13 1904)  IMAGING US OB LESS THAN 14 WEEKS WITH OB TRANSVAGINAL Result Date: 12/14/2022 CLINICAL DATA:  Cramping for 1 week. Beta HCG 20,000. LMP 11/04/2022. EXAM: OBSTETRIC <14 WK [place] AND TRANSVAGINAL OB US TECHNIQUE: Both transabdominal and transvaginal ultrasound examinations were performed for complete evaluation of the gestation as well as the maternal uterus, adnexal regions, and pelvic cul-de-sac. Transvaginal technique was performed to assess early pregnancy. COMPARISON:  None Available. FINDINGS: Intrauterine  gestational sac: None Yolk sac:  Visualized.  5 mm. Embryo:  Not Visualized. Cardiac Activity: Not Visualized. Heart Rate: Not applicable MSD: 14.3 mm   6 w   2 d Subchorionic hemorrhage:  None visualized. Maternal uterus/adnexae: Normal ovaries. Trace free fluid in the pelvis. IMPRESSION: Probable early intrauterine gestational sac and yolk sac, but no fetal pole, or cardiac activity yet visualized. Recommend follow-up quantitative B-HCG levels and follow-up US in 14 days to assess viability. This recommendation follows SRU consensus guidelines: Diagnostic Criteria for Nonviable Pregnancy Early in the First Trimester. Malva Limes Med 2013; 160:1093-23. Electronically Signed   By: Joselyn Glassman  Stutzman M.D.   On: 12/14/2022 22:38    MAU Management/MDM: Orders Placed This Encounter  Procedures   Wet prep, genital   Urinalysis, Routine w reflex microscopic -Urine, Clean Catch   Discharge patient    No orders of the defined types were placed in this encounter.   Patient presents with blood when she wiped and stable cramping for past week. Underwent ectopic rule-out when in MAU 12/13. At that time, found an IUP of ~[redacted] weeks gestation. Repeated wet prep, UA today without signs of vaginitis or UTI to be causing symptoms. Patient reassuringly stable. Do not feel that an ultrasound would be of much help at this time, given less than 7 days since last one which would be too short to assess viability.  Discussed with patient that although vaginal bleeding during early pregnancy is abnormal, however a common complication. At this time, I do not see signs of instability, infection, or miscarriage. Discussed that although I do not see signs of miscarriage currently, it is a possibility in the future. This risk significantly decreases after 12 weeks. I discussed that she should return if she develops fever, passes clots/tissue, has severe pain.  She has repeat viability scan Monday, OB intake next week, and is taking  PNV.  ASSESSMENT 1. Vaginal bleeding affecting early pregnancy   2. [redacted] weeks gestation of pregnancy     PLAN Discharge home with strict return precautions. Allergies as of 12/19/2022   No Known Allergies      Medication List     TAKE these medications    acetaminophen 500 MG tablet Commonly known as: TYLENOL Take 1,000 mg by mouth every 6 (six) hours as needed.   prenatal vitamin w/FE, FA 27-1 MG Tabs tablet Take 1 tablet by mouth daily at 12 noon.         Wylene Simmer, MD OB Fellow 12/19/2022  12:37 PM

## 2022-12-24 ENCOUNTER — Ambulatory Visit (HOSPITAL_COMMUNITY)
Admission: RE | Admit: 2022-12-24 | Discharge: 2022-12-24 | Disposition: A | Discharge status: Home / Self Care | Payer: Medicaid Other | Source: Ambulatory Visit | Attending: Adult Health | Admitting: Adult Health

## 2022-12-24 ENCOUNTER — Encounter: Payer: Self-pay | Admitting: Obstetrics and Gynecology

## 2022-12-24 DIAGNOSIS — O3680X0 Pregnancy with inconclusive fetal viability, not applicable or unspecified: Secondary | ICD-10-CM | POA: Insufficient documentation

## 2022-12-28 ENCOUNTER — Encounter: Payer: Medicaid Other | Admitting: Obstetrics and Gynecology

## 2022-12-31 ENCOUNTER — Encounter: Payer: Self-pay | Admitting: Adult Health

## 2022-12-31 DIAGNOSIS — O208 Other hemorrhage in early pregnancy: Secondary | ICD-10-CM | POA: Insufficient documentation

## 2023-01-02 NOTE — L&D Delivery Note (Signed)
 OB/GYN Faculty Practice Delivery Note  Erin Strickland is a 23 y.o. G1P0 s/p SVB at [redacted]w[redacted]d. She was admitted for SOL .   ROM: 2h 87m with clear fluid GBS Status: negative Maximum Maternal Temperature: 98.7  Labor Progress: SOL with progression to complete 2/2   Delivery Date/Time: 08/18/2023 @ 2352 Delivery: Called to room and patient was complete and pushing. Head delivered OA to ROT. No nuchal cord present. Shoulder and body delivered in usual fashion. Infant with spontaneous cry, placed on mother's abdomen, dried and stimulated. Cord clamped x 2 after 3-minute delay, and cut by FOB Tyshawn. Cord blood drawn. Placenta delivered spontaneously, intact, with 3-vessel cord. Fundus firm with massage and Pitocin . Labia, perineum, vagina, and cervix inspected, bilateral labial into 1st degree left vaginal laceration laceration identified and repaired with 3.0 vicryl, excellent hemostasis and approximation noted.   Placenta: manual, Cleatus, suspect intact, although due to condition of placenta unable to determine with certainty. Antibiotics and cytotec  postpartum.  Complications: PPH with 1080 QBL, received pitocin , TXA and rectal cytotec , manual removal of placenta Lacerations: bilateral labial into 1st degree left vaginal laceration identified and repaired with 3.0 vicryl in the usual fashion, excellent hemostasis and approximation noted QBL: 1080 Analgesia: epidural  Postpartum Planning [place].Gant ] transfer orders to MB [ ]  discharge summary started & shared Galerius.Gant ] message to sent to schedule follow-up  [ X] lists updated -- Antibiotics on labor and q8h for manual removal of placenta -- Cytotec  series (unable to give methergine for Bps) postpartum due to blood lost pp along with concern of placenta  Infant: girl  APGARs  8/9  3450 g  Camie Rote, MSN, CNM, RNC-OB Certified Nurse Midwife, [facility] 08/19/2023 1:27 AM

## 2023-01-08 ENCOUNTER — Encounter: Payer: Self-pay | Admitting: Advanced Practice Midwife

## 2023-01-09 ENCOUNTER — Other Ambulatory Visit: Payer: Self-pay

## 2023-01-09 MED ORDER — BENZONATATE 100 MG PO CAPS: 100.0000 mg | ORAL_CAPSULE | Freq: Three times a day (TID) | ORAL | 0 refills | Status: DC | PRN

## 2023-01-10 ENCOUNTER — Telehealth (INDEPENDENT_AMBULATORY_CARE_PROVIDER_SITE_OTHER): Payer: Medicaid Other

## 2023-01-10 DIAGNOSIS — Z3491 Encounter for supervision of normal pregnancy, unspecified, first trimester: Secondary | ICD-10-CM | POA: Diagnosis not present

## 2023-01-10 DIAGNOSIS — Z3A10 10 weeks gestation of pregnancy: Secondary | ICD-10-CM | POA: Diagnosis not present

## 2023-01-10 DIAGNOSIS — Z3490 Encounter for supervision of normal pregnancy, unspecified, unspecified trimester: Secondary | ICD-10-CM | POA: Insufficient documentation

## 2023-01-10 MED ORDER — BLOOD PRESSURE KIT DEVI: 1.0000 | Freq: Every day | 0 refills | Status: AC

## 2023-01-10 NOTE — Progress Notes (Signed)
 New OB Intake  I connected with Elois Button  on 01/10/23 at  2:15 PM EST by MyChart Video Visit and verified that I am speaking with the correct person using two identifiers. Nurse is located at [facility] and pt is located at home.  I discussed the limitations, risks, security and privacy concerns of performing an evaluation and management service by telephone and the availability of in person appointments. I also discussed with the patient that there may be a patient responsible charge related to this service. The patient expressed understanding and agreed to proceed.  I explained I am completing New OB Intake today. We discussed EDD of 08/11/2023, Date entered prior to episode creation. Pt is G1P0. I reviewed her allergies, medications and Medical/Surgical/OB history.    Patient Active Problem List   Diagnosis Date Noted   Subchorionic hemorrhage in first trimester 12/31/2022   Encounter to determine fetal viability of pregnancy 12/07/2022   Less than [redacted] weeks gestation of pregnancy 12/07/2022   Pregnancy examination or test, positive result 12/07/2022   Patellofemoral pain syndrome of both knees 01/20/2020   Acute pain of left shoulder 01/20/2020   Neck nodule 01/20/2020    Concerns addressed today  Delivery Plans Plans to deliver at [facility]. Discussed the nature of our practice with multiple providers including residents and students. Due to the size of the practice, the delivering provider may not be the same as those providing prenatal care.   Patient is not interested in water birth. Offered upcoming OB visit with CNM to discuss further.  MyChart/Babyscripts MyChart access verified. I explained pt will have some visits in office and some virtually. Babyscripts instructions given and order placed. Patient verifies receipt of registration text/e-mail. Account successfully created and app downloaded. If patient is a candidate for Optimized scheduling, add to sticky note.   Blood  Pressure Cuff/Weight Scale Blood pressure cuff ordered for patient to pick-up from [employer]. Explained after first prenatal appt pt will check weekly and document in Babyscripts. Patient does have weight scale.  Anatomy US  Explained first scheduled US  will be around 19 weeks. Anatomy US  scheduled for 03/18/23 at 1315.  Is patient a CenteringPregnancy candidate?  Not a Candidate Declined due to Enrolled in [facility]  Is patient a Mom+Baby Combined Care candidate?  Accepted   If accepted, confirm patient does not intend to move from the area for at least 12 months, then notify Mom+Baby staff  Interested in [place]? If yes, send referral and doula dot phrase.   Is patient a candidate for Babyscripts Optimization? Yes, patient accepted    First visit review I reviewed new OB appt with patient. Explained pt will be seen by [place]  Claudene, CNM on 01/17/23 at 1415 at first visit. Discussed [place] genetic screening with patient. Patient interested in both [place] and [facility].. Routine prenatal labs  need to be collected at first visit.     Last Pap No PAP smear done in past.   Mercer DELENA Plaster, RN 01/10/2023  2:07 PM

## 2023-01-11 ENCOUNTER — Encounter: Payer: Self-pay | Admitting: Obstetrics and Gynecology

## 2023-01-17 ENCOUNTER — Encounter: Payer: Medicaid Other | Admitting: Advanced Practice Midwife

## 2023-01-25 ENCOUNTER — Telehealth: Payer: Medicaid Other | Admitting: Family Medicine

## 2023-01-25 DIAGNOSIS — Z3490 Encounter for supervision of normal pregnancy, unspecified, unspecified trimester: Secondary | ICD-10-CM

## 2023-01-25 NOTE — Progress Notes (Signed)
For the safety of you and your child, I recommend a face to face office visit with a health care provider.  Many mothers need to take medicines during their pregnancy and while nursing.  Almost all medicines pass into the breast milk in small quantities.  Most are generally considered safe for a mother to take but some medicines must be avoided.  After reviewing your E-Visit request, I recommend that you consult your OB/GYN or pediatrician for medical advice in relation to your condition and prescription medications while pregnant or breastfeeding.  NOTE:  There will be NO CHARGE for this eVisit  If you are having a true medical emergency please call 911.    For an urgent face to face visit, [facility] has six urgent care centers for your convenience:     [facility] at [facility] Directions [phone] [address] [place], [place] [address]    [facility] Urgent Care Center [facility]) Get Driving Directions [phone] [address] [place], [place] [address]  [facility] Health Urgent Care Center [facility]) Get Driving Directions [phone] [address] [place],  [place]  [address]  [facility] Urgent Care at [facility] Get Driving Directions [phone] [address] [place], [place] [address]   [facility] Health Urgent Care at [facility] Get Driving Directions  [phone] [address] [place], [place] [address]   [facility] at [facility] Directions [phone] [address] [place], [place] [address]  Your MyChart E-visit questionnaire answers were reviewed by a board certified advanced clinical practitioner to complete your personal care plan based on your specific symptoms.  Thank you for using e-Visits.

## 2023-01-29 ENCOUNTER — Other Ambulatory Visit (HOSPITAL_COMMUNITY)
Admission: RE | Admit: 2023-01-29 | Discharge: 2023-01-29 | Disposition: A | Discharge status: Home / Self Care | Payer: Medicaid Other | Source: Ambulatory Visit | Attending: Obstetrics and Gynecology | Admitting: Obstetrics and Gynecology

## 2023-01-29 ENCOUNTER — Other Ambulatory Visit: Payer: Self-pay

## 2023-01-29 ENCOUNTER — Ambulatory Visit (INDEPENDENT_AMBULATORY_CARE_PROVIDER_SITE_OTHER): Payer: Medicaid Other | Admitting: Obstetrics and Gynecology

## 2023-01-29 ENCOUNTER — Encounter: Payer: Self-pay | Admitting: Obstetrics and Gynecology

## 2023-01-29 VITALS — BP 103/72 | HR 82 | Wt 140.3 lb

## 2023-01-29 DIAGNOSIS — Z3492 Encounter for supervision of normal pregnancy, unspecified, second trimester: Secondary | ICD-10-CM | POA: Insufficient documentation

## 2023-01-29 DIAGNOSIS — Z3482 Encounter for supervision of other normal pregnancy, second trimester: Secondary | ICD-10-CM | POA: Diagnosis not present

## 2023-01-29 DIAGNOSIS — Z3A12 12 weeks gestation of pregnancy: Secondary | ICD-10-CM | POA: Diagnosis not present

## 2023-01-29 DIAGNOSIS — Z1331 Encounter for screening for depression: Secondary | ICD-10-CM | POA: Diagnosis not present

## 2023-01-29 DIAGNOSIS — Z3481 Encounter for supervision of other normal pregnancy, first trimester: Secondary | ICD-10-CM | POA: Diagnosis not present

## 2023-01-29 DIAGNOSIS — Z3491 Encounter for supervision of normal pregnancy, unspecified, first trimester: Secondary | ICD-10-CM | POA: Diagnosis not present

## 2023-01-29 MED ORDER — ASPIRIN 81 MG PO TBEC: 81.0000 mg | DELAYED_RELEASE_TABLET | Freq: Every day | ORAL | 2 refills | Status: DC

## 2023-01-29 NOTE — Patient Instructions (Signed)
We highly recommend childbirth education to help you plan for labor and begin practicing coping skills (which will be needed with or without pain meds).  [place] Childbirth Education Options: Sign up by visiting [email or web address]  Childbirth ~ Self-Paced eClass (English and Spanish) This online class offers you the freedom to complete a childbirth education series in the comfort of your own home at your own pace.  Childbirth Class (In-Person 4-Week Series  or on Saturdays, Virtual 4-Week Series ~ [place]) This interactive in-person class series will help you and your partner prepare for your birth experience. Topics include: Labor & Birth, Comfort Measures, Breathing Techniques, Massage, Medical Interventions, Pain Management Options, Cesarean Birth, Postpartum Care, and Newborn Care  Comfort Techniques for Labor ~ In-Person Class [facility]) This interactive class is designed for parents-to-be who want to learn & practice hands-on skills to help relieve some of the discomfort of labor and encourage their babies to rotate toward the best position for birth. Moms and their partners will be able to try a variety of labor positions with birth balls and rebozos as well as practice breathing, relaxation, and visualization techniques.  Natural Childbirth Class (In-Person 5-Week Series, In-Person on Saturdays or Virtual 5-Week Series ~ [place]) This class series is designed for expectant parents who want to learn and practice natural methods of coping with the process of labor and childbirth.  Cesarean Birth Self-Paced eClass (English and Spanish) This online course provides comprehensive information you can trust as you prepare for a possible cesarean birth. In this class, you'll learn how to make your birth and recovery comfortable and joyful through instructive video clips, animations, and activities.  Waterbirth ~ Airline pilot Interested in a waterbirth? In addition to a consultation  with your credentialed waterbirth provider, this free, informational online class will help you discover whether waterbirth is the right fit for you. Not all obstetrical practices offer waterbirth, so check with your healthcare provider.  Tour Probation officer) - [facility] [employer] our 4 minute video tour of [facility] located in [place].   [place] Parenting Education Options:  Pregnancy 101 (Virtual) Congratulations on your pregnancy! This class is geared toward moms in their first trimester, but everyone is welcome. We are excited to guide you through all aspects of supporting a healthy pregnancy. You will learn what to expect at routine prenatal care appointments, common postpartum adjustments, basic infant safety, and breastfeeding.  Successful Partnering & Parenting ~ In-Person Workshop [facility]) This workshop inspires and equips partners of all economic levels, ages, and cultures to confidently care for their infants, support the birthing persons, and navigate their own transformations into new partners and parents. Learning activities are geared towards supporting partner, but moms are welcome to attend.  'Baby & Me' Parenting Group (Virtual on Wednesdays at 11am) Enjoy this time discussing newborn & infant parenting topics and family adjustment issues with other new parents in a relaxed environment. Each week brings a new speaker or baby-centered activity. This group offers support and connection to parents as they journey through the adjustments and struggles of that sometimes overwhelming first year after the birth of a child.  Baby Safety, CPR, & Choking Class ~ Virtual This life-saving information is meant to encourage parents as they learn important safety and prevention tips as well as infant CPR and relief of choking.  Breastfeeding Class (In-Person in [place] or [employer]) Families learn what to expect in the  first days and weeks of breastfeeding your  newborn. IF YOU ARE AN EMPLOYEE TAKING THIS CLASS FOR CREDIT, DO NOT register yourself. Please e-mail [email or web address].   Breastfeeding Self-Paced eClass (English & Spanish) Families learn what to expect in the first days and weeks of breastfeeding your newborn.  Caring for Baby ~ In-Person, Virtual or Self-Paced Class This in-person class is for both expectant and adoptive parents who want to learn and practice the most up-to-date newborn care for their babies. Focus is on birth through the first six weeks of life.  CPR & Choking Relief for Infants & Children ~ In-Person Class [facility]) This in-person course is designed for any parent, expectant parent, or adult who cares for infants or children. Participants learn and demonstrate cardiopulmonary resuscitation and choking relief procedures for both infants and children.  Grandparent Love ~ In-Person Class Grandparents will learn the most updated infant care and safety recommendations. They will discover ways to support their own children during the transition into the parenting role and receive tips on communicating with the new parents.  [place] Parenting Support Group Options:  Bereavement Grief Support Group (Pregnancy/Infant Loss) - Virtual This is an ongoing experience that meets once a month and is designed to help you honor the past, assist you in discovering tools to strengthen you today, and aid you in developing hope for the future.  Breastfeeding & Pumping Support Group (In-Person on Thursdays at 12pm or Virtual on Tuesdays at 5pm) Join [place] in-person each Thursday starting June 1st, 2023 at 12pm! This support group is free for all families looking for breastfeeding and/or pumping support.   Community-Based Childbirth Education Options:  [facility] Department Classes:  Childbirth education classes can help you get ready for a positive parenting experience. You  can also meet other expectant parents and get free stuff for your baby. Each class runs for five weeks on the same night and costs $45 for the mother-to-be and her support person. Medicaid covers the cost if you are eligible. Call [phone] to register.  [facility] offers a variety of programs for the [employer] and is another great way to get connected. Please go to [email or web address] for more information.  Childbirth With A Twist! Be informed of your options, get educated on birth, understand what your body is doing, learn how to cope, and have a lot of fun and laughs all while doing it either from the comfort of your couch OR in our cozy office and classroom space near the [place] airport. If you are taking a virtual class, then class is taught LIVE, so you can ask questions and receive answers in real-time from an experienced doula and childbirth educator.  This virtual childbirth education class will meet for five instruction times online.  Although we are based in [place], [place], this virtual class is open to anyone in the world. Please visit: [email or web address] for more information.  Books We Love: The Doula Guide to Childbirth by Harland German and Otila Back The First-Time Parent's Childbirth Handbook by Dr. Amie Critchley, CNM The Birth Partner by Truddie Crumble

## 2023-01-29 NOTE — Progress Notes (Signed)
INITIAL PRENATAL VISIT  Subjective:   Erin Strickland is being seen today for her first obstetrical visit.  She is at [redacted]w[redacted]d gestation by LMP.  Her obstetrical history is significant for  none . Relationship with FOB: significant other, living together. Patient does intend to breast feed. Pregnancy history fully reviewed.  Patient reports no complaints.  Indications for ASA therapy (per uptodate)  Two or more of the following: Nulliparity Yes Obesity (body mass index >30 kg/m2) No Family history of preeclampsia in mother or sister Age >=35 years No Sociodemographic characteristics (African American race, low socioeconomic level) Yes Personal risk factors (eg, previous pregnancy with low birth weight or small for gestational age infant, previous adverse pregnancy outcome [eg, stillbirth], interval >10 years between pregnancies) No   Objective:    Obstetric History OB History  Gravida Para Term Preterm AB Living  1       SAB IAB Ectopic Multiple Live Births          # Outcome Date GA Lbr Len/2nd Weight Sex Type Anes PTL Lv  1 Current             Past Medical History:  Diagnosis Date   Encounter to determine fetal viability of pregnancy 12/07/2022   GERD (gastroesophageal reflux disease)    Headache    Ovarian cyst     Past Surgical History:  Procedure Laterality Date   NO PAST SURGERIES      Current Outpatient Medications on File Prior to Visit  Medication Sig Dispense Refill   prenatal vitamin w/FE, FA (PRENATAL 1 + 1) 27-1 MG TABS tablet Take 1 tablet by mouth daily at 12 noon. 30 tablet 12   acetaminophen (TYLENOL) 500 MG tablet Take 1,000 mg by mouth every 6 (six) hours as needed. (Patient not taking: Reported on 01/29/2023)     benzonatate (TESSALON PERLES) 100 MG capsule Take 1 capsule (100 mg total) by mouth 3 (three) times daily as needed for cough. (Patient not taking: Reported on 01/29/2023) 20 capsule 0   Blood Pressure Monitoring (BLOOD PRESSURE KIT) DEVI  1 Device by Does not apply route daily. (Patient not taking: Reported on 01/29/2023) 1 each 0   No current facility-administered medications on file prior to visit.    No Known Allergies  Social History:  reports that she has never smoked. She has been exposed to tobacco smoke. She has never used smokeless tobacco. She reports that she does not currently use alcohol. She reports that she does not use drugs.  Family History  Problem Relation Age of Onset   Healthy Mother    Healthy Father    Diabetes Maternal Grandmother    Cancer Paternal Grandmother    Cancer Paternal Grandfather     The following portions of the patient's history were reviewed and updated as appropriate: allergies, current medications, past family history, past medical history, past social history, past surgical history and problem list.  Review of Systems Review of Systems  All other systems reviewed and are negative.     Physical Exam:  BP 103/72   Pulse 82   Wt 140 lb 5 oz (63.6 kg)   LMP 11/04/2022 (Exact Date)   BMI 23.35 kg/m  CONSTITUTIONAL: Well-developed, well-nourished female in no acute distress.  HENT:  Normocephalic, atraumatic.  Oropharynx is clear and moist EYES: Conjunctivae normal. No scleral icterus.  NECK: Normal range of motion, supple, no masses.  Normal thyroid.  SKIN: Skin is warm and dry.  MUSCULOSKELETAL: Normal range  of motion. No tenderness.  No cyanosis, clubbing, or edema.   NEUROLOGIC: Alert and oriented  PSYCHIATRIC: Normal mood and affect. Normal behavior. Normal judgment and thought content. CARDIOVASCULAR: Normal heart rate noted RESPIRATORY: normal  ABDOMEN: Soft PELVIC: Normal appearing external genitalia; normal appearing vaginal mucosa and cervix.  No abnormal discharge noted.  Pap smear obtained.    Fetal Heart Rate (bpm): 156   Movement: Absent       Assessment:    Pregnancy: G1P0  1. Encounter for supervision of low-risk pregnancy in second trimester  (Primary) 3. [redacted] weeks gestation of pregnancy  BP and FHR normal BP cuff brought in today from home vastly different than clinic BPs  Provided instructions when to follow up  Discussed [facility] include female and female providers,would prefer to have female for exams Unsure if want baby to have vaccines, discussed requirement for [facility] to have standard vaccinations, ok if they decide otherwise, will just have to switch to [facility]. They are going to talk about it and let [place] know.  Childbirth class information provided  - Hemoglobin A1c - CBC/D/Plt+RPR+Rh+ABO+RubIgG... - Culture, OB Urine - HORIZON Basic Panel - PANORAMA PRENATAL TEST - Comp Met (CMET) - Protein / creatinine ratio, urine - aspirin EC 81 MG tablet; Take 1 tablet (81 mg total) by mouth daily. Start taking when you are [redacted] weeks pregnant for rest of pregnancy for prevention of preeclampsia  Dispense: 300 tablet; Refill: 2     Initial labs drawn. Prenatal vitamins. Problem list reviewed and updated. Reviewed in detail the nature of the practice with collaborative care between  Genetic screening discussed: NIPS/First trimester screen/Quad/AFP requested. Role of ultrasound in pregnancy discussed; Anatomy US: ordered.  Follow up in 4 weeks. Discussed clinic routines, schedule of care and testing, genetic screening options, involvement of students and residents under the direct supervision of APPs and doctors and presence of female providers. Pt verbalized understanding.  Future Appointments  Date Time Provider Department Center  02/27/2023  3:15 PM Celedonio Savage, MD [facility]  03/18/2023  1:15 PM [facility] NURSE [facility]  03/18/2023  1:30 PM [facility] US3 [facility]    Sue Lush, FNP

## 2023-01-30 LAB — CBC/D/PLT+RPR+RH+ABO+RUBIGG...
Antibody Screen: NEGATIVE
Basophils Absolute: 0 10*3/uL (ref 0.0–0.2)
Basos: 0 %
EOS (ABSOLUTE): 0.1 10*3/uL (ref 0.0–0.4)
Eos: 1 %
HCV Ab: NONREACTIVE
HIV Screen 4th Generation wRfx: NONREACTIVE
Hematocrit: 37.8 % (ref 34.0–46.6)
Hemoglobin: 13.2 g/dL (ref 11.1–15.9)
Hepatitis B Surface Ag: NEGATIVE
Immature Grans (Abs): 0 10*3/uL (ref 0.0–0.1)
Immature Granulocytes: 0 %
Lymphocytes Absolute: 3.3 10*3/uL — ABNORMAL HIGH (ref 0.7–3.1)
Lymphs: 35 %
MCH: 30.8 pg (ref 26.6–33.0)
MCHC: 34.9 g/dL (ref 31.5–35.7)
MCV: 88 fL (ref 79–97)
Monocytes Absolute: 0.8 10*3/uL (ref 0.1–0.9)
Monocytes: 8 %
Neutrophils Absolute: 5.2 10*3/uL (ref 1.4–7.0)
Neutrophils: 56 %
Platelets: 241 10*3/uL (ref 150–450)
RBC: 4.28 x10E6/uL (ref 3.77–5.28)
RDW: 12.6 % (ref 11.7–15.4)
RPR Ser Ql: NONREACTIVE
Rh Factor: POSITIVE
Rubella Antibodies, IGG: 1.14 {index} (ref >0.99)
WBC: 9.5 10*3/uL (ref 3.4–10.8)

## 2023-01-30 LAB — PROTEIN / CREATININE RATIO, URINE
Creatinine, Urine: 125.6 mg/dL
Protein, Ur: 12.2 mg/dL
Protein/Creat Ratio: 97 mg/g{creat} (ref 0–200)

## 2023-01-30 LAB — HEMOGLOBIN A1C
Est. average glucose Bld gHb Est-mCnc: 94 mg/dL
Hgb A1c MFr Bld: 4.9 % (ref 4.8–5.6)

## 2023-01-30 LAB — COMPREHENSIVE METABOLIC PANEL
ALT: 24 [IU]/L (ref 0–32)
AST: 20 [IU]/L (ref 0–40)
Albumin: 4.1 g/dL (ref 4.0–5.0)
Alkaline Phosphatase: 59 [IU]/L (ref 44–121)
BUN/Creatinine Ratio: 19 (ref 9–23)
BUN: 7 mg/dL (ref 6–20)
Bilirubin Total: 0.3 mg/dL (ref 0.0–1.2)
CO2: 22 mmol/L (ref 20–29)
Calcium: 8.8 mg/dL (ref 8.7–10.2)
Chloride: 103 mmol/L (ref 96–106)
Creatinine, Ser: 0.36 mg/dL — ABNORMAL LOW (ref 0.57–1.00)
Globulin, Total: 2.4 g/dL (ref 1.5–4.5)
Glucose: 73 mg/dL (ref 70–99)
Potassium: 4 mmol/L (ref 3.5–5.2)
Sodium: 138 mmol/L (ref 134–144)
Total Protein: 6.5 g/dL (ref 6.0–8.5)
eGFR: 148 mL/min/{1.73_m2} (ref >59)

## 2023-01-30 LAB — HCV INTERPRETATION

## 2023-01-31 LAB — CYTOLOGY - PAP
Chlamydia: NEGATIVE
Comment: NEGATIVE
Comment: NORMAL
Diagnosis: NEGATIVE
Neisseria Gonorrhea: NEGATIVE

## 2023-01-31 LAB — CULTURE, OB URINE

## 2023-01-31 LAB — URINE CULTURE, OB REFLEX

## 2023-02-05 LAB — PANORAMA PRENATAL TEST FULL PANEL:PANORAMA TEST PLUS 5 ADDITIONAL MICRODELETIONS: FETAL FRACTION: 17.5

## 2023-02-06 LAB — HORIZON CUSTOM: REPORT SUMMARY: NEGATIVE

## 2023-02-27 ENCOUNTER — Ambulatory Visit (INDEPENDENT_AMBULATORY_CARE_PROVIDER_SITE_OTHER): Payer: Medicaid Other | Admitting: Family Medicine

## 2023-02-27 ENCOUNTER — Other Ambulatory Visit: Payer: Self-pay

## 2023-02-27 VITALS — BP 116/73 | HR 90 | Wt 145.0 lb

## 2023-02-27 DIAGNOSIS — Z3A16 16 weeks gestation of pregnancy: Secondary | ICD-10-CM | POA: Diagnosis not present

## 2023-02-27 DIAGNOSIS — Z3492 Encounter for supervision of normal pregnancy, unspecified, second trimester: Secondary | ICD-10-CM | POA: Diagnosis not present

## 2023-02-27 NOTE — Progress Notes (Signed)
   PRENATAL VISIT NOTE  Subjective:  Erin Strickland is a 23 y.o. G1P0 at [redacted]w[redacted]d being seen today for ongoing prenatal care.  She is currently monitored for the following issues for this low-risk pregnancy and has Patellofemoral pain syndrome of both knees; Acute pain of left shoulder; Neck nodule; Encounter to determine fetal viability of pregnancy; Less than [redacted] weeks gestation of pregnancy; Pregnancy examination or test, positive result; Subchorionic hemorrhage in first trimester; and Encounter for supervision of low-risk pregnancy on their problem list.  Patient reports no bleeding, no contractions, no cramping, and no leaking.  Contractions: Not present. Vag. Bleeding: None.  Movement: Absent. Denies leaking of fluid.   The following portions of the patient's history were reviewed and updated as appropriate: allergies, current medications, past family history, past medical history, past social history, past surgical history and problem list.   Objective:   Vitals:   02/27/23 1534  BP: 116/73  Pulse: 90  Weight: 145 lb (65.8 kg)    Fetal Status: Fetal Heart Rate (bpm): 150   Movement: Absent     General:  Alert, oriented and cooperative. Patient is in no acute distress.  Skin: Skin is warm and dry. No rash noted.   Cardiovascular: Normal heart rate noted  Respiratory: Normal respiratory effort, no problems with respiration noted  Abdomen: Soft, gravid, appropriate for gestational age.  Pain/Pressure: Absent     Pelvic: Cervical exam deferred        Extremities: Normal range of motion.     Mental Status: Normal mood and affect. Normal behavior. Normal judgment and thought content.   Assessment and Plan:  Pregnancy: G1P0 at [redacted]w[redacted]d 1. Encounter for supervision of low-risk pregnancy in second trimester FHR and BP appropriate Had discussion regarding vaccines and they are going to bring a list of vaccines that they are concerned about. Follow-up in 4 weeks  2. [redacted] weeks gestation of  pregnancy (Primary) - AFP, Serum, Open Spina Bifida  Preterm labor symptoms and general obstetric precautions including but not limited to vaginal bleeding, contractions, leaking of fluid and fetal movement were reviewed in detail with the patient. Please refer to After Visit Summary for other counseling recommendations.   No follow-ups on file.  Future Appointments  Date Time Provider Department Center  03/18/2023  1:15 PM [facility] NURSE [facility]  03/18/2023  1:30 PM [facility] US3 [facility]  03/26/2023  3:35 PM Celedonio Savage, MD [facility]  04/24/2023  3:35 PM Federico Flake, MD [facility]    Celedonio Savage, MD

## 2023-03-01 ENCOUNTER — Encounter: Payer: Self-pay | Admitting: Family Medicine

## 2023-03-01 LAB — AFP, SERUM, OPEN SPINA BIFIDA
AFP MoM: 0.86
AFP Value: 30.5 ng/mL
Gest. Age on Collection Date: 16.4 wk
Maternal Age At EDD: 21.9 a
OSBR Risk 1 IN: 10000
Test Results:: NEGATIVE
Weight: 145 [lb_av]

## 2023-03-18 ENCOUNTER — Ambulatory Visit: Payer: Medicaid Other

## 2023-03-19 ENCOUNTER — Ambulatory Visit: Attending: Advanced Practice Midwife

## 2023-03-19 ENCOUNTER — Ambulatory Visit: Admitting: *Deleted

## 2023-03-19 ENCOUNTER — Ambulatory Visit: Admitting: Obstetrics

## 2023-03-19 ENCOUNTER — Encounter: Payer: Self-pay | Admitting: *Deleted

## 2023-03-19 DIAGNOSIS — Z3A19 19 weeks gestation of pregnancy: Secondary | ICD-10-CM | POA: Diagnosis not present

## 2023-03-19 DIAGNOSIS — O352XX0 Maternal care for (suspected) hereditary disease in fetus, not applicable or unspecified: Secondary | ICD-10-CM

## 2023-03-19 DIAGNOSIS — O208 Other hemorrhage in early pregnancy: Secondary | ICD-10-CM | POA: Diagnosis not present

## 2023-03-19 DIAGNOSIS — O358XX0 Maternal care for other (suspected) fetal abnormality and damage, not applicable or unspecified: Secondary | ICD-10-CM

## 2023-03-19 DIAGNOSIS — Z3491 Encounter for supervision of normal pregnancy, unspecified, first trimester: Secondary | ICD-10-CM | POA: Insufficient documentation

## 2023-03-19 DIAGNOSIS — Z363 Encounter for antenatal screening for malformations: Secondary | ICD-10-CM | POA: Diagnosis not present

## 2023-03-19 NOTE — Progress Notes (Signed)
 MFM Consult Note  Erin Strickland is currently at 19 weeks and 2 days.  She was seen for a detailed fetal anatomy scan.  She denies any problems in her current pregnancy.  She had a cell free DNA test earlier in her pregnancy which indicated a low risk for trisomy 75, 76, and 13. A female fetus is predicted.   She was informed that the fetal growth and amniotic fluid level were appropriate for her gestational age.   There were no obvious fetal anomalies noted on today's ultrasound exam.  The limitations of ultrasound in the detection of all anomalies was discussed.    The patient stated that all of her questions were answered today.    No further exams were scheduled in our office.  A total of 30 minutes was spent counseling and coordinating the care for this patient.  Greater than 50% of the time was spent in direct face-to-face contact.

## 2023-03-26 ENCOUNTER — Other Ambulatory Visit: Payer: Self-pay

## 2023-03-26 ENCOUNTER — Ambulatory Visit (INDEPENDENT_AMBULATORY_CARE_PROVIDER_SITE_OTHER): Payer: Medicaid Other | Admitting: Family Medicine

## 2023-03-26 ENCOUNTER — Encounter: Payer: Self-pay | Admitting: Family Medicine

## 2023-03-26 VITALS — BP 127/82 | HR 90 | Wt 151.0 lb

## 2023-03-26 DIAGNOSIS — Z3492 Encounter for supervision of normal pregnancy, unspecified, second trimester: Secondary | ICD-10-CM

## 2023-03-26 DIAGNOSIS — Z3A20 20 weeks gestation of pregnancy: Secondary | ICD-10-CM

## 2023-03-26 NOTE — Progress Notes (Signed)
   PRENATAL VISIT NOTE  Subjective:  Erin Strickland is a 23 y.o. G1P0 at [redacted]w[redacted]d being seen today for ongoing prenatal care.  She is currently monitored for the following issues for this low-risk pregnancy and has Subchorionic hemorrhage in first trimester and Encounter for supervision of low-risk pregnancy on their problem list.  Patient reports no bleeding, no contractions, no cramping, and no leaking.  Contractions: Not present. Vag. Bleeding: None.  Movement: Present. Denies leaking of fluid.   The following portions of the patient's history were reviewed and updated as appropriate: allergies, current medications, past family history, past medical history, past social history, past surgical history and problem list.   Objective:   Vitals:   03/26/23 1554  BP: 127/82  Pulse: 90  Weight: 151 lb (68.5 kg)    Fetal Status: Fetal Heart Rate (bpm): 152   Movement: Present     General:  Alert, oriented and cooperative. Patient is in no acute distress.  Skin: Skin is warm and dry. No rash noted.   Cardiovascular: Normal heart rate noted  Respiratory: Normal respiratory effort, no problems with respiration noted  Abdomen: Soft, gravid, appropriate for gestational age.  Pain/Pressure: Present     Pelvic: Cervical exam deferred        Extremities: Normal range of motion.  Edema: None  Mental Status: Normal mood and affect. Normal behavior. Normal judgment and thought content.   Assessment and Plan:  Pregnancy: G1P0 at [redacted]w[redacted]d 1. Encounter for supervision of low-risk pregnancy in second trimester (Primary) FHR and BP appropriate today Anatomy scan previously done and within normal limits Discussed vaccines again at this visit and patients are still deciding  2. [redacted] weeks gestation of pregnancy   Preterm labor symptoms and general obstetric precautions including but not limited to vaginal bleeding, contractions, leaking of fluid and fetal movement were reviewed in detail with the  patient. Please refer to After Visit Summary for other counseling recommendations.   Return in about 4 weeks (around 04/23/2023).  Future Appointments  Date Time Provider Department Center  04/24/2023  3:35 PM Federico Flake, MD [facility]    Celedonio Savage, MD

## 2023-04-24 ENCOUNTER — Encounter: Payer: Medicaid Other | Admitting: Family Medicine

## 2023-04-26 ENCOUNTER — Encounter: Payer: Self-pay | Admitting: Family Medicine

## 2023-04-26 ENCOUNTER — Other Ambulatory Visit

## 2023-04-26 ENCOUNTER — Other Ambulatory Visit: Payer: Self-pay

## 2023-04-26 ENCOUNTER — Ambulatory Visit (INDEPENDENT_AMBULATORY_CARE_PROVIDER_SITE_OTHER): Admitting: Family Medicine

## 2023-04-26 VITALS — BP 114/75 | HR 82 | Wt 160.0 lb

## 2023-04-26 DIAGNOSIS — Z3A24 24 weeks gestation of pregnancy: Secondary | ICD-10-CM

## 2023-04-26 DIAGNOSIS — Z3492 Encounter for supervision of normal pregnancy, unspecified, second trimester: Secondary | ICD-10-CM

## 2023-04-26 NOTE — Progress Notes (Signed)
   PRENATAL VISIT NOTE  Subjective:  Erin Strickland is a 23 y.o. G1P0 at [redacted]w[redacted]d being seen today for ongoing prenatal care.  She is currently monitored for the following issues for this low-risk pregnancy and has Subchorionic hemorrhage in first trimester and Encounter for supervision of low-risk pregnancy on their problem list.  Patient reports no bleeding, no contractions, no cramping, and no leaking.  Contractions: Not present. Vag. Bleeding: None.  Movement: Present. Denies leaking of fluid.   The following portions of the patient's history were reviewed and updated as appropriate: allergies, current medications, past family history, past medical history, past social history, past surgical history and problem list.   Objective:   Vitals:   04/26/23 0846  BP: 114/75  Pulse: 82  Weight: 160 lb (72.6 kg)    Fetal Status: Fetal Heart Rate (bpm): 143   Movement: Present     General:  Alert, oriented and cooperative. Patient is in no acute distress.  Skin: Skin is warm and dry. No rash noted.   Cardiovascular: Normal heart rate noted  Respiratory: Normal respiratory effort, no problems with respiration noted  Abdomen: Soft, gravid, appropriate for gestational age.  Pain/Pressure: Absent     Pelvic: Cervical exam deferred        Extremities: Normal range of motion.  Edema: None  Mental Status: Normal mood and affect. Normal behavior. Normal judgment and thought content.   Assessment and Plan:  Pregnancy: G1P0 at [redacted]w[redacted]d 1. Encounter for supervision of low-risk pregnancy in second trimester (Primary) FHR and BP appropriate today Continue routine prenatal care 28-week labs at next visit Discussed vaccinations and they are open to required vaccines  2. [redacted] weeks gestation of pregnancy   Preterm labor symptoms and general obstetric precautions including but not limited to vaginal bleeding, contractions, leaking of fluid and fetal movement were reviewed in detail with the  patient. Please refer to After Visit Summary for other counseling recommendations.   No follow-ups on file.  Future Appointments  Date Time Provider Department Center  05/15/2023  3:15 PM Abner Ables, MD [facility]  05/30/2023  3:15 PM Teena Feast, MD [facility]    Ferdie Housekeeper, MD

## 2023-04-26 NOTE — Patient Instructions (Signed)
   Considering Waterbirth? Guide for patients at Center for [facility]) Why consider waterbirth? Gentle birth for babies  Less pain medicine used in labor  May allow for passive descent/less pushing  May reduce perineal tears  More mobility and instinctive maternal position changes  Increased maternal relaxation   Is waterbirth safe? What are the risks of infection, drowning or other complications? Infection:  Very low risk (3.7 % for tub vs 4.8% for bed)  7 in 8000 waterbirths with documented infection  Poorly cleaned equipment most common cause  Slightly lower group B strep transmission rate  Drowning  Maternal:  Very low risk  Related to seizures or fainting  Newborn:  Very low risk. No evidence of increased risk of respiratory problems in multiple large studies  Physiological protection from breathing under water  Avoid underwater birth if there are any fetal complications  Once baby's head is out of the water, keep it out.  Birth complication  Some reports of cord trauma, but risk decreased by bringing baby to surface gradually  No evidence of increased risk of shoulder dystocia. Mothers can usually change positions faster in water than in a bed, possibly aiding the maneuvers to free the shoulder.   There are 2 things you MUST do to have a waterbirth with [facility]: Attend a waterbirth class at [facility]   3rd Wednesday of every month from 7-9 pm (virtual during COVID) [employer] at [email or web address] or [email or web address] or by calling [phone] Bring [place] the certificate from the class to your prenatal appointment or send via MyChart Meet with a midwife at 36 weeks* to see if you can still plan a waterbirth and to sign the consent.   *We also recommend that you schedule as many of your prenatal visits with a midwife as possible.    Helpful information: You may want to bring a bathing suit top to the hospital  to wear during labor but this is optional.  All other supplies are provided by the hospital. Please arrive at the hospital with signs of active labor, and do not wait at home until late in labor. It takes 45 min- 1 hour for fetal monitoring, and check in to your room to take place, plus transport and filling of the waterbirth tub.    Things that would prevent you from having a waterbirth: Premature, <37wks  Previous cesarean birth  Presence of thick meconium-stained fluid  Multiple gestation (Twins, triplets, etc.)  Uncontrolled diabetes or gestational diabetes requiring medication  Hypertension diagnosed in pregnancy or preexisting hypertension (gestational hypertension, preeclampsia, or chronic hypertension) Fetal growth restriction (your baby measures less than 10th percentile on ultrasound) Heavy vaginal bleeding  Non-reassuring fetal heart rate  Active infection (MRSA, etc.). Group B Strep is NOT a contraindication for waterbirth.  If your labor has to be induced and induction method requires continuous monitoring of the baby's heart rate  Other risks/issues identified by your obstetrical provider   Please remember that birth is unpredictable. Under certain unforeseeable circumstances your provider may advise against giving birth in the tub. These decisions will be made on a case-by-case basis and with the safety of you and your baby as our highest priority.    Updated 04/05/21

## 2023-05-14 ENCOUNTER — Other Ambulatory Visit: Payer: Self-pay

## 2023-05-14 DIAGNOSIS — Z3493 Encounter for supervision of normal pregnancy, unspecified, third trimester: Secondary | ICD-10-CM

## 2023-05-15 ENCOUNTER — Encounter: Payer: Self-pay | Admitting: Family Medicine

## 2023-05-15 ENCOUNTER — Other Ambulatory Visit: Payer: Self-pay

## 2023-05-15 ENCOUNTER — Ambulatory Visit: Admitting: Family Medicine

## 2023-05-15 ENCOUNTER — Encounter: Admitting: Family Medicine

## 2023-05-15 VITALS — BP 103/69 | HR 86 | Wt 163.2 lb

## 2023-05-15 DIAGNOSIS — Z3A27 27 weeks gestation of pregnancy: Secondary | ICD-10-CM

## 2023-05-15 DIAGNOSIS — O208 Other hemorrhage in early pregnancy: Secondary | ICD-10-CM

## 2023-05-15 DIAGNOSIS — Z3493 Encounter for supervision of normal pregnancy, unspecified, third trimester: Secondary | ICD-10-CM

## 2023-05-15 NOTE — Progress Notes (Signed)
   PRENATAL VISIT NOTE  Subjective:  Erin Strickland is a 23 y.o. G1P0 at [redacted]w[redacted]d being seen today for ongoing prenatal care.  She is currently monitored for the following issues for this low-risk pregnancy and has Subchorionic hemorrhage in first trimester and Encounter for supervision of low-risk pregnancy on their problem list.  Patient reports no complaints.  Contractions: Not present. Vag. Bleeding: None.  Movement: Present. Denies leaking of fluid.   The following portions of the patient's history were reviewed and updated as appropriate: allergies, current medications, past family history, past medical history, past social history, past surgical history and problem list.   Objective:   Vitals:   05/15/23 0839  BP: 103/69  Pulse: 86  Weight: 163 lb 3.2 oz (74 kg)     Fetal Status: Fetal Heart Rate (bpm): 159   Movement: Present      General: Alert, oriented and cooperative. Patient is in no acute distress.  Skin: Skin is warm and dry. No rash noted.   Cardiovascular: Normal heart rate noted  Respiratory: Normal respiratory effort, no problems with respiration noted  Abdomen: Soft, gravid, appropriate for gestational age.  Pain/Pressure: Present     Pelvic: Cervical exam deferred        Extremities: Normal range of motion.  Edema: None  Mental Status: Normal mood and affect. Normal behavior. Normal judgment and thought content.   Assessment and Plan:  Pregnancy: G1P0 at [redacted]w[redacted]d 1. Subchorionic hemorrhage in first trimester (Primary) No bleeding  2. Encounter for supervision of low-risk pregnancy in third trimester Up to date Completing 3rd trimester labs today Rh positive,  rhogam indicated- No Offered Tdap and did notaccept today.  FH appropriate Vigorous movement.  Concerns today: abdominal pain for 5-10 min last night, belly go hard and was painful. Reviewed this is likely a BH contraction. Recommended increased water intake Reviewed upcoming topics for postpartum  planning.    Preterm labor symptoms and general obstetric precautions including but not limited to vaginal bleeding, contractions, leaking of fluid and fetal movement were reviewed in detail with the patient. Please refer to After Visit Summary for other counseling recommendations.   No follow-ups on file.  Future Appointments  Date Time Provider Department Center  05/30/2023  3:15 PM Teena Feast, MD [facility]  06/13/2023  3:15 PM Teena Feast, MD [facility]  06/27/2023  3:15 PM Ebony Goldstein, MD [facility]    Abner Ables, MD

## 2023-05-16 ENCOUNTER — Ambulatory Visit: Payer: Self-pay | Admitting: Family Medicine

## 2023-05-16 DIAGNOSIS — Z3493 Encounter for supervision of normal pregnancy, unspecified, third trimester: Secondary | ICD-10-CM

## 2023-05-16 LAB — GLUCOSE TOLERANCE, 2 HOURS W/ 1HR
Glucose, 1 hour: 115 mg/dL (ref 70–179)
Glucose, 2 hour: 126 mg/dL (ref 70–152)
Glucose, Fasting: 72 mg/dL (ref 70–91)

## 2023-05-17 LAB — CBC
Hematocrit: 39.3 % (ref 34.0–46.6)
Hemoglobin: 13.2 g/dL (ref 11.1–15.9)
MCH: 31.5 pg (ref 26.6–33.0)
MCHC: 33.6 g/dL (ref 31.5–35.7)
MCV: 94 fL (ref 79–97)
Platelets: 252 10*3/uL (ref 150–450)
RBC: 4.19 x10E6/uL (ref 3.77–5.28)
RDW: 12.5 % (ref 11.7–15.4)
WBC: 10 10*3/uL (ref 3.4–10.8)

## 2023-05-17 LAB — HIV ANTIBODY (ROUTINE TESTING W REFLEX): HIV Screen 4th Generation wRfx: NONREACTIVE

## 2023-05-17 LAB — RPR: RPR Ser Ql: NONREACTIVE

## 2023-05-30 ENCOUNTER — Encounter: Payer: Self-pay | Admitting: Family Medicine

## 2023-05-30 ENCOUNTER — Ambulatory Visit (INDEPENDENT_AMBULATORY_CARE_PROVIDER_SITE_OTHER): Admitting: Family Medicine

## 2023-05-30 ENCOUNTER — Other Ambulatory Visit: Payer: Self-pay

## 2023-05-30 VITALS — BP 110/73 | HR 80 | Wt 167.4 lb

## 2023-05-30 DIAGNOSIS — Z3493 Encounter for supervision of normal pregnancy, unspecified, third trimester: Secondary | ICD-10-CM

## 2023-05-30 NOTE — Patient Instructions (Signed)

## 2023-05-30 NOTE — Progress Notes (Signed)
   Subjective:  Erin Strickland is a 23 y.o. G1P0 at [redacted]w[redacted]d being seen today for ongoing prenatal care.  She is currently monitored for the following issues for this low-risk pregnancy and has Subchorionic hemorrhage in first trimester and Encounter for supervision of low-risk pregnancy on their problem list.  Patient reports no complaints.  Contractions: Irritability. Vag. Bleeding: None.  Movement: Present. Denies leaking of fluid.   The following portions of the patient's history were reviewed and updated as appropriate: allergies, current medications, past family history, past medical history, past social history, past surgical history and problem list. Problem list updated.  Objective:   Vitals:   05/30/23 1525  BP: 110/73  Pulse: 80  Weight: 167 lb 6.4 oz (75.9 kg)    Fetal Status: Fetal Heart Rate (bpm): 147 Fundal Height: 29 cm Movement: Present     General:  Alert, oriented and cooperative. Patient is in no acute distress.  Skin: Skin is warm and dry. No rash noted.   Cardiovascular: Normal heart rate noted  Respiratory: Normal respiratory effort, no problems with respiration noted  Abdomen: Soft, gravid, appropriate for gestational age. Pain/Pressure: Absent     Pelvic: Vag. Bleeding: None     Cervical exam deferred        Extremities: Normal range of motion.  Edema: Trace  Mental Status: Normal mood and affect. Normal behavior. Normal judgment and thought content.   Urinalysis:      Assessment and Plan:  Pregnancy: G1P0 at [redacted]w[redacted]d  1. Encounter for supervision of low-risk pregnancy in third trimester (Primary) BP and FHR normal FH appropriate Up to date on labs  Preterm labor symptoms and general obstetric precautions including but not limited to vaginal bleeding, contractions, leaking of fluid and fetal movement were reviewed in detail with the patient. Please refer to After Visit Summary for other counseling recommendations.  Return in about 2 weeks (around  06/13/2023) for Dyad patient, ob visit.   Teena Feast, MD

## 2023-06-10 ENCOUNTER — Encounter: Payer: Self-pay | Admitting: Family Medicine

## 2023-06-13 ENCOUNTER — Encounter: Payer: Self-pay | Admitting: Family Medicine

## 2023-06-13 ENCOUNTER — Ambulatory Visit (INDEPENDENT_AMBULATORY_CARE_PROVIDER_SITE_OTHER): Admitting: Family Medicine

## 2023-06-13 ENCOUNTER — Other Ambulatory Visit: Payer: Self-pay

## 2023-06-13 VITALS — BP 106/72 | HR 80 | Wt 167.5 lb

## 2023-06-13 DIAGNOSIS — Z3493 Encounter for supervision of normal pregnancy, unspecified, third trimester: Secondary | ICD-10-CM

## 2023-06-13 NOTE — Progress Notes (Signed)
   Subjective:  Erin Strickland is a 23 y.o. G1P0 at [redacted]w[redacted]d being seen today for ongoing prenatal care.  She is currently monitored for the following issues for this low-risk pregnancy and has Subchorionic hemorrhage in first trimester and Encounter for supervision of low-risk pregnancy on their problem list.  Patient reports no complaints.  Contractions: Not present. Vag. Bleeding: None.  Movement: Present. Denies leaking of fluid.   The following portions of the patient's history were reviewed and updated as appropriate: allergies, current medications, past family history, past medical history, past social history, past surgical history and problem list. Problem list updated.  Objective:   Vitals:   06/13/23 1517  BP: 106/72  Pulse: 80  Weight: 167 lb 8 oz (76 kg)    Fetal Status: Fetal Heart Rate (bpm): 130 Fundal Height: 31 cm Movement: Present     General:  Alert, oriented and cooperative. Patient is in no acute distress.  Skin: Skin is warm and dry. No rash noted.   Cardiovascular: Normal heart rate noted  Respiratory: Normal respiratory effort, no problems with respiration noted  Abdomen: Soft, gravid, appropriate for gestational age. Pain/Pressure: Present     Pelvic: Vag. Bleeding: None     Cervical exam deferred        Extremities: Normal range of motion.  Edema: Trace  Mental Status: Normal mood and affect. Normal behavior. Normal judgment and thought content.   Urinalysis:      Assessment and Plan:  Pregnancy: G1P0 at [redacted]w[redacted]d  1. Encounter for supervision of low-risk pregnancy in third trimester (Primary) BP and FHR normal FH 31 cm, appropriate At patient request discussed labor pain control options in detail Interested in waterbirth, given link for class and will have her scheduled with Dr. Daisey Dryer to discuss waterbirth  Preterm labor symptoms and general obstetric precautions including but not limited to vaginal bleeding, contractions, leaking of fluid and fetal  movement were reviewed in detail with the patient. Please refer to After Visit Summary for other counseling recommendations.  Return in 2 weeks (on 06/27/2023) for Dyad patient, ob visit.   Teena Feast, MD

## 2023-06-13 NOTE — Patient Instructions (Addendum)
 Considering Waterbirth? Guide for patients at Center for [facility]) Why consider waterbirth? Gentle birth for babies  Less pain medicine used in labor  May allow for passive descent/less pushing  May reduce perineal tears  More mobility and instinctive maternal position changes  Increased maternal relaxation   Is waterbirth safe? What are the risks of infection, drowning or other complications? Infection:  Very low risk (3.7 % for tub vs 4.8% for bed)  7 in 8000 waterbirths with documented infection  Poorly cleaned equipment most common cause  Slightly lower group B strep transmission rate  Drowning  Maternal:  Very low risk  Related to seizures or fainting  Newborn:  Very low risk. No evidence of increased risk of respiratory problems in multiple large studies  Physiological protection from breathing under water  Avoid underwater birth if there are any fetal complications  Once baby's head is out of the water, keep it out.  Birth complication  Some reports of cord trauma, but risk decreased by bringing baby to surface gradually  No evidence of increased risk of shoulder dystocia. Mothers can usually change positions faster in water than in a bed, possibly aiding the maneuvers to free the shoulder.   There are 2 things you MUST do to have a waterbirth with [facility]: Attend a waterbirth class at [facility]   3rd Wednesday of every month from 7-9 pm (virtual during COVID) [employer] at [email or web address] or [email or web address] or by calling [phone] Bring us  the certificate from the class to your prenatal appointment or send via MyChart Meet with a midwife at 36 weeks* to see if you can still plan a waterbirth and to sign the consent.   *We also recommend that you schedule as many of your prenatal visits with a midwife as possible.    Helpful information: You may want to bring a bathing suit top to the hospital  to wear during labor but this is optional.  All other supplies are provided by the hospital. Please arrive at the hospital with signs of active labor, and do not wait at home until late in labor. It takes 45 min- 1 hour for fetal monitoring, and check in to your room to take place, plus transport and filling of the waterbirth tub.    Things that would prevent you from having a waterbirth: Premature, <37wks  Previous cesarean birth  Presence of thick meconium-stained fluid  Multiple gestation (Twins, triplets, etc.)  Uncontrolled diabetes or gestational diabetes requiring medication  Hypertension diagnosed in pregnancy or preexisting hypertension (gestational hypertension, preeclampsia, or chronic hypertension) Fetal growth restriction (your baby measures less than 10th percentile on ultrasound) Heavy vaginal bleeding  Non-reassuring fetal heart rate  Active infection (MRSA, etc.). Group B Strep is NOT a contraindication for waterbirth.  If your labor has to be induced and induction method requires continuous monitoring of the baby's heart rate  Other risks/issues identified by your obstetrical provider   Please remember that birth is unpredictable. Under certain unforeseeable circumstances your provider may advise against giving birth in the tub. These decisions will be made on a case-by-case basis and with the safety of you and your baby as our highest priority.    Updated 04/05/21   Birth Control Options Birth control is also called contraception. Birth control prevents pregnancy. There are many types of birth control. Work with your health care provider to find the best option for you. Birth control that uses hormones These types of  birth control have hormones in them to prevent pregnancy. Birth control implant This is a small tube that is put into the skin of your arm. The tube can stay in for up to 3 years. Birth control shot These are shots you get every 3 months. Birth control  pills This is a pill you take every day. You need to take it at the same time each day. Birth control patch This is a patch that you put on your skin. You change it 1 time each week for 3 weeks. After that, you take the patch off for 1 week. Vaginal ring  This is a soft plastic ring that you put in your vagina. The ring is left in for 3 weeks. Then, you take it out for 1 week. Then, you put a new ring in. Barrier methods  Female condom This is a thin covering that you put on the penis before sex. The condom is thrown away after sex. Female condom This is a soft, loose covering that you put in the vagina before sex. The condom is thrown away after sex. Diaphragm A diaphragm is a soft barrier that is shaped like a bowl. It must be made to fit your body. You put it in the vagina before sex with a chemical that kills sperm called spermicide. A diaphragm should be left in the vagina for 6-8 hours after sex and taken out within 24 hours. You need to replace a diaphragm: Every 1-2 years. After giving birth. After gaining more than 15 lb (6.8 kg). If you have surgery on your pelvis. Cervical cap This is a small, soft cup that fits over the cervix. The cervix is the lowest part of the uterus. It's put in the vagina before sex, along with spermicide. The cap must be made for you. The cap should be left in for 6-8 hours after sex. It is taken out within 48 hours. A cervical cap must be prescribed and fit to your body by a provider. It should be replaced every 2 years. Sponge This is a small sponge that is put into the vagina before sex. It must be left in for at least 6 hours after sex. It must be taken out within 30 hours and thrown away. Spermicides These are chemicals that kill or stop sperm from getting into the uterus. They may be a pill, cream, jelly, or foam that you put into your vagina. They should be used at least 10-15 minutes before sex. Intrauterine device An intrauterine device (IUD) is  a device that's put in the uterus by a provider. There are two types: Hormone IUD. This kind can stay in for 3-5 years. Copper IUD. This kind can stay in for 10 years. Permanent birth control Female tubal ligation This is surgery to block the fallopian tubes. Female sterilization This is a surgery, called a vasectomy, to tie off the tubes that carry sperm in men. This method takes 3 months to work. Other forms of birth control must be used for 3 months. Natural planning methods This means not having sex on the days the female partner could get pregnant. Here are some types of natural planning birth control: Using a calendar: To keep track of the length of each menstrual cycle. To find out what days pregnancy can happen. To plan to not have sex on days when pregnancy can happen. Watching for signs of ovulation and not having sex during this time. The female partner can check for ovulation by keeping track  of their temperature each day. They can also look for changes in the mucus that comes from the cervix. Where to find more information Centers for Disease Control and Prevention: TonerPromos.no. Then: Enter birth control in the search box. This information is not intended to replace advice given to you by your health care provider. Make sure you discuss any questions you have with your health care provider. Document Revised: 09/20/2022 Document Reviewed: 02/13/2022 Elsevier Patient Education  2024 [employer].

## 2023-06-25 NOTE — Progress Notes (Unsigned)
 PRENATAL VISIT NOTE  Subjective:  Erin Strickland is a 23 y.o. G1P0 at [redacted]w[redacted]d being seen today for ongoing prenatal care.  She is currently monitored for the following issues for this low-risk pregnancy and has Subchorionic hemorrhage in first trimester and Encounter for supervision of low-risk pregnancy on their problem list.  Patient reports no complaints.  Contractions: Irritability. Vag. Bleeding: None.  Movement: Present. Denies leaking of fluid.   The following portions of the patient's history were reviewed and updated as appropriate: allergies, current medications, past family history, past medical history, past social history, past surgical history and problem list.   Objective:    Vitals:   06/26/23 1408  BP: 115/73  Pulse: 90  Weight: 170 lb (77.1 kg)    Fetal Status:  Fetal Heart Rate (bpm): 130 Fundal Height: 33 cm Movement: Present    General: Alert, oriented and cooperative. Patient is in no acute distress.  Skin: Skin is warm and dry. No rash noted.   Cardiovascular: Normal heart rate noted  Respiratory: Normal respiratory effort, no problems with respiration noted  Abdomen: Soft, gravid, appropriate for gestational age.  Pain/Pressure: Present     Pelvic: Cervical exam deferred        Extremities: Normal range of motion.  Edema: Trace  Mental Status: Normal mood and affect. Normal behavior. Normal judgment and thought content.   Skin Tag Removal/Excision of Lesion: Verbal and written consent obtained. Time out performed.   Area was cleaned with betadine. Injected with 1cc of Lidocaine . Area tested and anesthesia was effective. 11 blade used to cut off lesion at the based. Silver nitrate applied to the base for hemostasis.  Bleeding minimal. Reviewed when to expected pathology results.   Assessment and Plan:  Pregnancy: G1P0 at [redacted]w[redacted]d 1. Subchorionic hemorrhage in first trimester (Primary) No bleeding  2. Encounter for supervision of low-risk pregnancy in  third trimester Up to date FH appropriate Vigorous movement  Concerns/Questions today: - skin tag - irritated. Desires removal-- see above - took WB class, brought certificate - discussed taking Natural CB class at [facility]  - Pt interested in waterbirth and has attended the class.  - Reviewed conditions in labor that will risk her out of water immersion including thick meconium or blood stained amniotic fluid, non-reassuring fetal status on monitor, excessive bleeding, hypertension, dizziness, use of IV meds, damaged equipment or staffing that does not allow for water immersion, etc.  - The attending midwife must be on the unit for water immersion to begin; pt understands this may delay the start of water immersion. - Reminded pt that signing consent in labor at the hospital also acknowledges they will exit the tub if the attending midwife requests. - Consent given to patient for review.  Consent will be reviewed and signed at the hospital by the waterbirth provider prior to use of the tub. - Discussed other labor support options if waterbirth becomes unavailable, including position change, freedom of movement, use of birthing ball, and/or use of hydrotherapy in the shower (dependent upon medical condition/provider discretion).    3. [redacted] weeks gestation of pregnancy   Preterm labor symptoms and general obstetric precautions including but not limited to vaginal bleeding, contractions, leaking of fluid and fetal movement were reviewed in detail with the patient. Please refer to After Visit Summary for other counseling recommendations.   Return in about 1 week (around 07/03/2023) for Routine prenatal care, [facility].  Future Appointments  Date Time Provider Department Center  07/11/2023  3:55 PM Lola Donnice HERO, MD [facility]  07/15/2023 10:55 AM Eldonna Suzen Octave, MD [facility]  07/25/2023  3:55 PM Lola Donnice HERO, MD [facility]  08/01/2023  3:35 PM Lola Donnice HERO, MD  [facility]    Suzen Octave Eldonna, MD

## 2023-06-26 ENCOUNTER — Other Ambulatory Visit: Payer: Self-pay

## 2023-06-26 ENCOUNTER — Other Ambulatory Visit (HOSPITAL_COMMUNITY)
Admission: RE | Admit: 2023-06-26 | Discharge: 2023-06-26 | Disposition: A | Discharge status: Home / Self Care | Source: Ambulatory Visit | Attending: Family Medicine | Admitting: Family Medicine

## 2023-06-26 ENCOUNTER — Ambulatory Visit (INDEPENDENT_AMBULATORY_CARE_PROVIDER_SITE_OTHER): Admitting: Family Medicine

## 2023-06-26 VITALS — BP 115/73 | HR 90 | Wt 170.0 lb

## 2023-06-26 DIAGNOSIS — L918 Other hypertrophic disorders of the skin: Secondary | ICD-10-CM | POA: Insufficient documentation

## 2023-06-26 DIAGNOSIS — O99713 Diseases of the skin and subcutaneous tissue complicating pregnancy, third trimester: Secondary | ICD-10-CM

## 2023-06-26 DIAGNOSIS — Z3493 Encounter for supervision of normal pregnancy, unspecified, third trimester: Secondary | ICD-10-CM

## 2023-06-26 DIAGNOSIS — O208 Other hemorrhage in early pregnancy: Secondary | ICD-10-CM | POA: Diagnosis not present

## 2023-06-26 DIAGNOSIS — Z3A33 33 weeks gestation of pregnancy: Secondary | ICD-10-CM | POA: Diagnosis not present

## 2023-06-26 NOTE — Patient Instructions (Signed)
   Considering Waterbirth? Guide for patients at Center for [facility]) Why consider waterbirth? Gentle birth for babies  Less pain medicine used in labor  May allow for passive descent/less pushing  May reduce perineal tears  More mobility and instinctive maternal position changes  Increased maternal relaxation   Is waterbirth safe? What are the risks of infection, drowning or other complications? Infection:  Very low risk (3.7 % for tub vs 4.8% for bed)  7 in 8000 waterbirths with documented infection  Poorly cleaned equipment most common cause  Slightly lower group B strep transmission rate  Drowning  Maternal:  Very low risk  Related to seizures or fainting  Newborn:  Very low risk. No evidence of increased risk of respiratory problems in multiple large studies  Physiological protection from breathing under water  Avoid underwater birth if there are any fetal complications  Once baby's head is out of the water, keep it out.  Birth complication  Some reports of cord trauma, but risk decreased by bringing baby to surface gradually  No evidence of increased risk of shoulder dystocia. Mothers can usually change positions faster in water than in a bed, possibly aiding the maneuvers to free the shoulder.   There are 2 things you MUST do to have a waterbirth with [facility]: Attend a waterbirth class at [facility]   3rd Wednesday of every month from 7-9 pm (virtual during COVID) [employer] at [email or web address] or [email or web address] or by calling [phone] Bring [place] the certificate from the class to your prenatal appointment or send via MyChart Meet with a midwife at 36 weeks* to see if you can still plan a waterbirth and to sign the consent.   *We also recommend that you schedule as many of your prenatal visits with a midwife as possible.    Helpful information: You may want to bring a bathing suit top to the hospital  to wear during labor but this is optional.  All other supplies are provided by the hospital. Please arrive at the hospital with signs of active labor, and do not wait at home until late in labor. It takes 45 min- 1 hour for fetal monitoring, and check in to your room to take place, plus transport and filling of the waterbirth tub.    Things that would prevent you from having a waterbirth: Premature, <37wks  Previous cesarean birth  Presence of thick meconium-stained fluid  Multiple gestation (Twins, triplets, etc.)  Uncontrolled diabetes or gestational diabetes requiring medication  Hypertension diagnosed in pregnancy or preexisting hypertension (gestational hypertension, preeclampsia, or chronic hypertension) Fetal growth restriction (your baby measures less than 10th percentile on ultrasound) Heavy vaginal bleeding  Non-reassuring fetal heart rate  Active infection (MRSA, etc.). Group B Strep is NOT a contraindication for waterbirth.  If your labor has to be induced and induction method requires continuous monitoring of the baby's heart rate  Other risks/issues identified by your obstetrical provider   Please remember that birth is unpredictable. Under certain unforeseeable circumstances your provider may advise against giving birth in the tub. These decisions will be made on a case-by-case basis and with the safety of you and your baby as our highest priority.    Updated 04/05/21

## 2023-06-27 ENCOUNTER — Encounter: Admitting: Family Medicine

## 2023-06-27 ENCOUNTER — Encounter: Payer: Self-pay | Admitting: Family Medicine

## 2023-06-28 LAB — SURGICAL PATHOLOGY

## 2023-06-29 ENCOUNTER — Ambulatory Visit: Payer: Self-pay | Admitting: Family Medicine

## 2023-07-03 ENCOUNTER — Encounter (HOSPITAL_COMMUNITY): Payer: Self-pay | Admitting: Obstetrics and Gynecology

## 2023-07-03 ENCOUNTER — Inpatient Hospital Stay (HOSPITAL_COMMUNITY)
Admission: AD | Admit: 2023-07-03 | Discharge: 2023-07-03 | Disposition: A | Discharge status: Home / Self Care | Attending: Obstetrics and Gynecology | Admitting: Obstetrics and Gynecology

## 2023-07-03 DIAGNOSIS — O479 False labor, unspecified: Secondary | ICD-10-CM

## 2023-07-03 DIAGNOSIS — Z3A34 34 weeks gestation of pregnancy: Secondary | ICD-10-CM

## 2023-07-03 DIAGNOSIS — O4703 False labor before 37 completed weeks of gestation, third trimester: Secondary | ICD-10-CM | POA: Insufficient documentation

## 2023-07-03 DIAGNOSIS — O26853 Spotting complicating pregnancy, third trimester: Secondary | ICD-10-CM | POA: Diagnosis not present

## 2023-07-03 DIAGNOSIS — N930 Postcoital and contact bleeding: Secondary | ICD-10-CM

## 2023-07-03 LAB — WET PREP, GENITAL
Clue Cells Wet Prep HPF POC: NONE SEEN
Sperm: NONE SEEN
Trich, Wet Prep: NONE SEEN
WBC, Wet Prep HPF POC: >=10 — AB (ref <10)
Yeast Wet Prep HPF POC: NONE SEEN

## 2023-07-03 LAB — URINALYSIS, ROUTINE W REFLEX MICROSCOPIC
Bilirubin Urine: NEGATIVE
Glucose, UA: NEGATIVE mg/dL
Hgb urine dipstick: NEGATIVE
Ketones, ur: NEGATIVE mg/dL
Leukocytes,Ua: NEGATIVE
Nitrite: NEGATIVE
Protein, ur: NEGATIVE mg/dL
Specific Gravity, Urine: 1.021 (ref 1.005–1.030)
pH: 7 (ref 5.0–8.0)

## 2023-07-03 NOTE — MAU Provider Note (Signed)
 Chief Complaint:  Pelvic Pain and Contractions   HPI   Event Date/Time   First Provider Initiated Contact with Patient 07/03/23 1756      Erin Strickland is a 23 y.o. G1P0 at [redacted]w[redacted]d who presents to maternity admissions reporting irregular contractions today and noticed some spotting/vaginal bleeding after wiping when using the BR today. Denies any active red vaginal bleeding, LOF, and reports good FM's. Patient reports recent sexual activity yesterday. Denies any vaginal itching/burning or irration and no urinary discomfort.  Pregnancy Course: [facility]   Past Medical History:  Diagnosis Date   Encounter to determine fetal viability of pregnancy 12/07/2022   GERD (gastroesophageal reflux disease)    Headache    Ovarian cyst    OB History  Gravida Para Term Preterm AB Living  1       SAB IAB Ectopic Multiple Live Births          # Outcome Date GA Lbr Len/2nd Weight Sex Type Anes PTL Lv  1 Current            Past Surgical History:  Procedure Laterality Date   NO PAST SURGERIES     Family History  Problem Relation Age of Onset   Healthy Mother    Healthy Father    Diabetes Maternal Grandmother    Cancer Paternal Grandmother    Cancer Paternal Grandfather    Social History   Tobacco Use   Smoking status: Never    Passive exposure: Yes   Smokeless tobacco: Never   Tobacco comments:    mom smokes outside  Vaping Use   Vaping status: Never Used  Substance Use Topics   Alcohol use: Not Currently   Drug use: Never   No Known Allergies Medications Prior to Admission  Medication Sig Dispense Refill Last Dose/Taking   acetaminophen (TYLENOL) 500 MG tablet Take 500 mg by mouth every 6 (six) hours as needed.   07/02/2023 Evening   aspirin  EC 81 MG tablet Take 1 tablet (81 mg total) by mouth daily. Start taking when you are [redacted] weeks pregnant for rest of pregnancy for prevention of preeclampsia 300 tablet 2 07/02/2023   prenatal vitamin w/FE, FA (PRENATAL 1 + 1) 27-1 MG TABS  tablet Take 1 tablet by mouth daily at 12 noon. 30 tablet 12 07/02/2023   Blood Pressure Monitoring (BLOOD PRESSURE KIT) DEVI 1 Device by Does not apply route daily. 1 each 0     I have reviewed patient's Past Medical Hx, Surgical Hx, Family Hx, Social Hx, medications and allergies.   ROS  Pertinent items noted in HPI and remainder of comprehensive ROS otherwise negative.   PHYSICAL EXAM  Patient Vitals for the past 24 hrs:  BP Temp Pulse Resp  07/03/23 1816 119/68 -- 80 --  07/03/23 1749 122/73 -- 92 --  07/03/23 1731 121/77 98.4 F (36.9 C) 98 18    Constitutional: Well-developed, well-nourished female in no acute distress.  Cardiovascular: normal rate & rhythm, warm and well-perfused Respiratory: normal effort, no problems with respiration noted GI: Abd soft, non-tender, gravid MS: Extremities nontender, no edema, normal ROM Neurologic: Alert and oriented x 4.  GU: no CVA tenderness Pelvic: Exam chaperoned by Jolynn Spurlock- Frizell RN No pooling, no evidence of vaginal bleeding, cervix visually closed SVE: L/Closed    Fetal Tracing: Cat 1 reactive Baseline: 135 Variability: moderate  Accelerations: present Decelerations:absent Toco: irritability   Labs: No results found for this or any previous visit (from the past 24  hours).  Imaging:  No results found.  MDM & MAU COURSE  MDM:  HIGH  R/O PTL  Prenatal records reviewed Physical exam with pelvic as noted above UA Wet Prep GC NST for GA and fetal well being  No evidence of vaginal bleeding or PTL at this time given cervix is closed and no ctx on NST likely Braxton Hicks contractions and s/p  Post coital bleeding   Plan for discharge with precautions and OB f/u as scheduled . Will contact patient if any of the cultures pending are abnormal  with appropriate treatment    MAU Course: Orders Placed This Encounter  Procedures   Wet prep, genital   Urinalysis, Routine w reflex microscopic -Urine, Clean  Catch   Discharge patient Discharge disposition: 01-Home or Self Care; Discharge patient date: 07/03/2023    I have reviewed the patient chart and performed the physical exam . I have ordered & interpreted the lab results and reviewed and interpreted the NST Medications ordered as stated below.  A/P as described below.  Counseling and education provided and patient agreeable  with plan as described below. Verbalized understanding.    ASSESSMENT   1. Braxton Hicks contractions   2. PCB (post coital bleeding)   3. [redacted] weeks gestation of pregnancy     PLAN  Discharge home in stable condition with return precautions.   See AVS for full description of information given to the patient including both verbal and written. Patient verbalized understanding and agrees with the plan as described above.     Follow-up Information     [facility] Follow up.   Specialty: Obstetrics and Gynecology Why: If symptoms worsen or fail to resolve, As scheduled for ongoing prenatal care Contact information: [address] [place]  [address] [phone]                Allergies as of 07/03/2023   No Known Allergies      Medication List     TAKE these medications    acetaminophen 500 MG tablet Commonly known as: TYLENOL Take 500 mg by mouth every 6 (six) hours as needed.   aspirin  EC 81 MG tablet Take 1 tablet (81 mg total) by mouth daily. Start taking when you are [redacted] weeks pregnant for rest of pregnancy for prevention of preeclampsia   Blood Pressure Kit Devi 1 Device by Does not apply route daily.   prenatal vitamin w/FE, FA 27-1 MG Tabs tablet Take 1 tablet by mouth daily at 12 noon.        Olam Dalton, MSN, [facility], [facility]

## 2023-07-03 NOTE — MAU Note (Signed)
 Erin Strickland is a 23 y.o. at [redacted]w[redacted]d here in MAU reporting: increased pelvic pain and pressure that comes and goes a few times an hours. That  started this morning. Went to Br and hs some blood when wiping. Good fetal movement reported. Stated hs had back pain last night before she went to bed.  LMP:  Onset of complaint: today Pain score: 8 Vitals:   07/03/23 1731 07/03/23 1749  BP: 121/77 122/73  Pulse: 98 92  Resp: 18   Temp: 98.4 F (36.9 C)      FHT: 137  Lab orders placed from triage: u/a

## 2023-07-04 ENCOUNTER — Telehealth: Payer: Self-pay | Admitting: *Deleted

## 2023-07-04 ENCOUNTER — Encounter: Payer: Self-pay | Admitting: Family Medicine

## 2023-07-04 LAB — GC/CHLAMYDIA PROBE AMP (~~LOC~~) NOT AT ARMC
Chlamydia: NEGATIVE
Comment: NEGATIVE
Comment: NORMAL
Neisseria Gonorrhea: NEGATIVE

## 2023-07-04 NOTE — Telephone Encounter (Signed)
 Pt left VM message stating that she had a skin removal procedure about one week ago and the scab has fallen off. She reports that there is some 'slimy stuff' where the scab was and wants to know if this is a concern. I returned pt's call and discussed her concern. She stated there is small amount of clear drainage but no bleeding from the area. I requested that she send a Mychart message with image attached and I will call her back after review.  Pt was able to send the message with image. I called her back and left voicemail message.  I stated that there is no cause for concern. This area will continue to heal. If she observes bleeding, new or additional swelling, increased pain or drainage she should let us  know.

## 2023-07-11 ENCOUNTER — Ambulatory Visit (INDEPENDENT_AMBULATORY_CARE_PROVIDER_SITE_OTHER): Admitting: Family Medicine

## 2023-07-11 ENCOUNTER — Other Ambulatory Visit: Payer: Self-pay

## 2023-07-11 ENCOUNTER — Encounter: Payer: Self-pay | Admitting: Family Medicine

## 2023-07-11 VITALS — BP 113/77 | HR 97 | Wt 172.1 lb

## 2023-07-11 DIAGNOSIS — Z3493 Encounter for supervision of normal pregnancy, unspecified, third trimester: Secondary | ICD-10-CM

## 2023-07-11 NOTE — Patient Instructions (Signed)

## 2023-07-11 NOTE — Progress Notes (Signed)
   Subjective:  Erin Strickland is a 23 y.o. G1P0 at [redacted]w[redacted]d being seen today for ongoing prenatal care.  She is currently monitored for the following issues for this low-risk pregnancy and has Subchorionic hemorrhage in first trimester and Encounter for supervision of low-risk pregnancy on their problem list.  Patient reports no complaints.  Contractions: Irritability. Vag. Bleeding: None.  Movement: Present. Denies leaking of fluid.   The following portions of the patient's history were reviewed and updated as appropriate: allergies, current medications, past family history, past medical history, past social history, past surgical history and problem list. Problem list updated.  Objective:   Vitals:   07/11/23 1601  BP: 113/77  Pulse: 97  Weight: 172 lb 1.6 oz (78.1 kg)    Fetal Status: Fetal Heart Rate (bpm): 159   Movement: Present     General:  Alert, oriented and cooperative. Patient is in no acute distress.  Skin: Skin is warm and dry. No rash noted.   Cardiovascular: Normal heart rate noted  Respiratory: Normal respiratory effort, no problems with respiration noted  Abdomen: Soft, gravid, appropriate for gestational age. Pain/Pressure: Present     Pelvic: Vag. Bleeding: None     Cervical exam deferred        Extremities: Normal range of motion.  Edema: None  Mental Status: Normal mood and affect. Normal behavior. Normal judgment and thought content.   Urinalysis:      Assessment and Plan:  Pregnancy: G1P0 at [redacted]w[redacted]d  1. Encounter for supervision of low-risk pregnancy in third trimester (Primary) BP and FHR normal Swabs next visit Having some round ligament pain, discussed etiology and typical features, distinguished from contraction pain  Preterm labor symptoms and general obstetric precautions including but not limited to vaginal bleeding, contractions, leaking of fluid and fetal movement were reviewed in detail with the patient. Please refer to After Visit Summary for  other counseling recommendations.  No follow-ups on file.   Lola Donnice HERO, MD

## 2023-07-15 ENCOUNTER — Other Ambulatory Visit (HOSPITAL_COMMUNITY)
Admission: RE | Admit: 2023-07-15 | Discharge: 2023-07-15 | Disposition: A | Discharge status: Home / Self Care | Source: Ambulatory Visit | Attending: Family Medicine | Admitting: Family Medicine

## 2023-07-15 ENCOUNTER — Ambulatory Visit (INDEPENDENT_AMBULATORY_CARE_PROVIDER_SITE_OTHER): Admitting: Family Medicine

## 2023-07-15 ENCOUNTER — Other Ambulatory Visit: Payer: Self-pay

## 2023-07-15 VITALS — BP 120/77 | HR 108 | Wt 171.0 lb

## 2023-07-15 DIAGNOSIS — Z3493 Encounter for supervision of normal pregnancy, unspecified, third trimester: Secondary | ICD-10-CM

## 2023-07-15 NOTE — Progress Notes (Signed)
   PRENATAL VISIT NOTE  Subjective:  Erin Strickland is a 23 y.o. G1P0 at [redacted]w[redacted]d being seen today for ongoing prenatal care.  She is currently monitored for the following issues for this low-risk pregnancy and has Subchorionic hemorrhage in first trimester and Encounter for supervision of low-risk pregnancy on their problem list.  Patient reports no complaints.  Contractions: Not present. Vag. Bleeding: None.  Movement: Present. Denies leaking of fluid.   The following portions of the patient's history were reviewed and updated as appropriate: allergies, current medications, past family history, past medical history, past social history, past surgical history and problem list.   Objective:    Vitals:   07/15/23 1110  BP: 120/77  Pulse: (!) 108  Weight: 171 lb (77.6 kg)    Fetal Status:  Fetal Heart Rate (bpm): 138 Fundal Height: 36 cm Movement: Present Presentation: Vertex  General: Alert, oriented and cooperative. Patient is in no acute distress.  Skin: Skin is warm and dry. No rash noted.   Cardiovascular: Normal heart rate noted  Respiratory: Normal respiratory effort, no problems with respiration noted  Abdomen: Soft, gravid, appropriate for gestational age.  Pain/Pressure: Absent     Pelvic: Cervical exam deferred        Extremities: Normal range of motion.  Edema: Trace  Mental Status: Normal mood and affect. Normal behavior. Normal judgment and thought content.   Assessment and Plan:  Pregnancy: G1P0 at [redacted]w[redacted]d 1. Encounter for supervision of low-risk pregnancy in third trimester (Primary) Up to date  FH appropriate Vigorous movement  Self collecting her swabs - Culture, beta strep (group b only) - GC/Chlamydia probe amp ([place])not at [facility]  Concerns: asking about position of baby. US  confirmed Vertex  - Pt interested in waterbirth and has attended the class.  - No questions today about WB. Reviewed consent in the hospital  Preterm labor symptoms and general  obstetric precautions including but not limited to vaginal bleeding, contractions, leaking of fluid and fetal movement were reviewed in detail with the patient. Please refer to After Visit Summary for other counseling recommendations.   No follow-ups on file.  Future Appointments  Date Time Provider Department Center  07/25/2023  3:55 PM Lola Donnice HERO, MD [facility]  08/01/2023  3:35 PM Lola Donnice HERO, MD [facility]  08/08/2023 10:35 AM Jhonny Augustin BROCKS, MD [facility]  08/15/2023 10:35 AM Lola Donnice HERO, [facility]    Suzen Maryan Masters, MD

## 2023-07-16 LAB — GC/CHLAMYDIA PROBE AMP (~~LOC~~) NOT AT ARMC
Chlamydia: NEGATIVE
Comment: NEGATIVE
Comment: NORMAL
Neisseria Gonorrhea: NEGATIVE

## 2023-07-17 ENCOUNTER — Encounter: Payer: Self-pay | Admitting: Family Medicine

## 2023-07-18 ENCOUNTER — Encounter: Admitting: Family Medicine

## 2023-07-19 LAB — CULTURE, BETA STREP (GROUP B ONLY): Strep Gp B Culture: NEGATIVE

## 2023-07-22 ENCOUNTER — Ambulatory Visit: Payer: Self-pay | Admitting: Family Medicine

## 2023-07-23 DIAGNOSIS — Z0289 Encounter for other administrative examinations: Secondary | ICD-10-CM

## 2023-07-24 ENCOUNTER — Encounter: Payer: Self-pay | Admitting: Family Medicine

## 2023-07-25 ENCOUNTER — Ambulatory Visit (INDEPENDENT_AMBULATORY_CARE_PROVIDER_SITE_OTHER): Admitting: Family Medicine

## 2023-07-25 ENCOUNTER — Other Ambulatory Visit: Payer: Self-pay

## 2023-07-25 VITALS — BP 112/73 | HR 101 | Wt 175.1 lb

## 2023-07-25 DIAGNOSIS — Z3493 Encounter for supervision of normal pregnancy, unspecified, third trimester: Secondary | ICD-10-CM

## 2023-07-25 NOTE — Progress Notes (Signed)
   PRENATAL VISIT NOTE  Subjective:  Erin Strickland is a 23 y.o. G1P0 at [redacted]w[redacted]d being seen today for ongoing prenatal care.  She is currently monitored for the following issues for this low-risk pregnancy and has Subchorionic hemorrhage in first trimester and Encounter for supervision of low-risk pregnancy on their problem list.  Patient reports no complaints.  Contractions: Irritability. Vag. Bleeding: None.  Movement: Present. Denies leaking of fluid.   The following portions of the patient's history were reviewed and updated as appropriate: allergies, current medications, past family history, past medical history, past social history, past surgical history and problem list.   Objective:    Vitals:   07/25/23 1605  BP: 112/73  Pulse: (!) 101  Weight: 79.4 kg    Fetal Status:  Fetal Heart Rate (bpm): 137   Movement: Present    General: Alert, oriented and cooperative. Patient is in no acute distress.  Skin: Skin is warm and dry. No rash noted.   Cardiovascular: Normal heart rate noted  Respiratory: Normal respiratory effort, no problems with respiration noted  Abdomen: Soft, gravid, appropriate for gestational age.  Pain/Pressure: Present     Pelvic: Cervical exam deferred        Extremities: Normal range of motion.  Edema: None  Mental Status: Normal mood and affect. Normal behavior. Normal judgment and thought content.   Assessment and Plan:  Pregnancy: G1P0 at [redacted]w[redacted]d 1. Encounter for supervision of low-risk pregnancy in third trimester (Primary) Doing well. No concerns. BP and FHR normal. F/U 1 week for ROB.  Term labor symptoms and general obstetric precautions including but not limited to vaginal bleeding, contractions, leaking of fluid and fetal movement were reviewed in detail with the patient. Please refer to After Visit Summary for other counseling recommendations.   No follow-ups on file.  Future Appointments  Date Time Provider Department Center  08/01/2023   3:35 PM Lola Donnice HERO, MD [facility]  08/08/2023 10:35 AM Jhonny Augustin BROCKS, MD [facility]  08/15/2023 10:35 AM Lola Donnice HERO, MD [facility]    Derrek JINNY Freund, NP Student

## 2023-07-30 ENCOUNTER — Encounter: Payer: Self-pay | Admitting: Family Medicine

## 2023-08-01 ENCOUNTER — Encounter: Payer: Self-pay | Admitting: Family Medicine

## 2023-08-01 ENCOUNTER — Other Ambulatory Visit: Payer: Self-pay

## 2023-08-01 ENCOUNTER — Ambulatory Visit (INDEPENDENT_AMBULATORY_CARE_PROVIDER_SITE_OTHER): Admitting: Family Medicine

## 2023-08-01 VITALS — BP 122/79 | HR 91 | Wt 175.0 lb

## 2023-08-01 DIAGNOSIS — Z3493 Encounter for supervision of normal pregnancy, unspecified, third trimester: Secondary | ICD-10-CM

## 2023-08-01 NOTE — Patient Instructions (Signed)

## 2023-08-01 NOTE — Progress Notes (Unsigned)
   Subjective:  Erin Strickland is a 23 y.o. G1P0 at [redacted]w[redacted]d being seen today for ongoing prenatal care.  She is currently monitored for the following issues for this low-risk pregnancy and has Subchorionic hemorrhage in first trimester and Encounter for supervision of low-risk pregnancy on their problem list.  Patient reports no complaints.  Contractions: Irritability. Vag. Bleeding: None.  Movement: Present. Denies leaking of fluid.   The following portions of the patient's history were reviewed and updated as appropriate: allergies, current medications, past family history, past medical history, past social history, past surgical history and problem list. Problem list updated.  Objective:   Vitals:   08/01/23 1548  BP: 122/79  Pulse: 91  Weight: 175 lb (79.4 kg)    Fetal Status: Fetal Heart Rate (bpm): 148   Movement: Present     General:  Alert, oriented and cooperative. Patient is in no acute distress.  Skin: Skin is warm and dry. No rash noted.   Cardiovascular: Normal heart rate noted  Respiratory: Normal respiratory effort, no problems with respiration noted  Abdomen: Soft, gravid, appropriate for gestational age. Pain/Pressure: Present (pelvic pressure)     Pelvic: Vag. Bleeding: None     Cervical exam deferred        Extremities: Normal range of motion.  Edema: Trace  Mental Status: Normal mood and affect. Normal behavior. Normal judgment and thought content.   Urinalysis:      Assessment and Plan:  Pregnancy: G1P0 at [redacted]w[redacted]d  1. Encounter for supervision of low-risk pregnancy in third trimester BP and FHR normal Vertex on bedside US  Discussed post dates scenarios Scheduled for 41 wk IOL Form faxed, orders placed  Term labor symptoms and general obstetric precautions including but not limited to vaginal bleeding, contractions, leaking of fluid and fetal movement were reviewed in detail with the patient. Please refer to After Visit Summary for other counseling  recommendations.  Return in 1 week (on 08/08/2023) for Dy[facility] patient, ob visit.   Lola Donnice HERO, MD

## 2023-08-06 ENCOUNTER — Telehealth: Payer: Self-pay

## 2023-08-06 ENCOUNTER — Inpatient Hospital Stay (HOSPITAL_COMMUNITY)
Admission: AD | Admit: 2023-08-06 | Discharge: 2023-08-06 | Disposition: A | Discharge status: Home / Self Care | Attending: Obstetrics and Gynecology | Admitting: Obstetrics and Gynecology

## 2023-08-06 ENCOUNTER — Encounter (HOSPITAL_COMMUNITY): Payer: Self-pay | Admitting: Obstetrics and Gynecology

## 2023-08-06 DIAGNOSIS — Z3493 Encounter for supervision of normal pregnancy, unspecified, third trimester: Secondary | ICD-10-CM

## 2023-08-06 DIAGNOSIS — Z113 Encounter for screening for infections with a predominantly sexual mode of transmission: Secondary | ICD-10-CM | POA: Diagnosis present

## 2023-08-06 DIAGNOSIS — O471 False labor at or after 37 completed weeks of gestation: Secondary | ICD-10-CM

## 2023-08-06 DIAGNOSIS — Z0371 Encounter for suspected problem with amniotic cavity and membrane ruled out: Secondary | ICD-10-CM | POA: Diagnosis present

## 2023-08-06 DIAGNOSIS — Z3A39 39 weeks gestation of pregnancy: Secondary | ICD-10-CM | POA: Diagnosis not present

## 2023-08-06 LAB — WET PREP, GENITAL
Clue Cells Wet Prep HPF POC: NONE SEEN
Sperm: NONE SEEN
Trich, Wet Prep: NONE SEEN
WBC, Wet Prep HPF POC: >=10 — AB (ref <10)
Yeast Wet Prep HPF POC: NONE SEEN

## 2023-08-06 LAB — RUPTURE OF MEMBRANE (ROM)PLUS: Rom Plus: NEGATIVE

## 2023-08-06 LAB — POCT FERN TEST: POCT Fern Test: NEGATIVE

## 2023-08-06 NOTE — Telephone Encounter (Signed)
 Pt called stating she felt that her mucous plug had partly come out and she was having mild cramping. Pt denies clear thin fluid, bleeding or foul odor. Pt states there is no pattern to her cramping and they are not painful. Endorses fetal movement. Pt is [redacted]w[redacted]d today. Labor precautions given to pt and encouraged pt to call us  back or send a MyChart message if she has anymore questions or concerns. Pt voiced understanding of information provided and denies any further questions or concerns.   Cyndee Molt, RN  08/05/2023

## 2023-08-06 NOTE — MAU Note (Signed)
 Erin Strickland is a 23 y.o. at [redacted]w[redacted]d here in MAU reporting: last night felt some fluid leak out thinks she is still leaking. Feeling hot and dizzy at times and having irreg ctx. Good fetal movement felt.   LMP:  Onset of complaint: yesterday Pain score: 8 Vitals:   08/06/23 1449  BP: 99/60  Pulse: 98  Resp: 18  Temp: 97.7 F (36.5 C)     FHT: 150  Lab orders placed from triage: labor eval, fern, ROM+

## 2023-08-06 NOTE — Telephone Encounter (Signed)
 Pt called nurse line with concerns of water breaking, sweaty feeling, and just not feeling well. Pt states that she feels like she is hot. Pt has not taken her tempeture. Asked pt if she has had any fluid leaking or anymore cramping like yesterday. Pt stated she had a little bit more mucous like discharge but it has also become more watery today. She also states that the cramping is greater than 5 minutes apart but they have become more painful than yesterday. She endorses positive fetal movement. Given pt is now not feeling well, has had continued cramping overnight, and she feels she is now leaking fluid advised pt to go MAU for further evaluation. Address for MAU provided to pt. Pt denies any further questions and voices understanding of all information provided.   Cyndee Molt, RN  08/06/2023

## 2023-08-06 NOTE — MAU Provider Note (Addendum)
 History     CSN: [id number]  Arrival date and time: 08/06/23 1400   Event Date/Time   First Provider Initiated Contact with Patient 08/06/2023  2:48 PM   Chief Complaint  Patient presents with   Labor Eval    HPI  Erin Strickland is a 23 y.o. G1P0 at [redacted]w[redacted]d who presents to the MAU for lightheadedness, cramping, and leaking fluid since last night. Patient states the cramping is worse today, denies a pattern to cramping. Patient also reports feeling hot and some blurry vision this AM, but none now. No complications this pregnancy and no issues with BP, normal ~120 systolic. Water intake is about 2 bottles per day. Endorses good fetal movement.  Past Medical History:  Diagnosis Date   Encounter to determine fetal viability of pregnancy 12/07/2022   GERD (gastroesophageal reflux disease)    Headache    Ovarian cyst     Past Surgical History:  Procedure Laterality Date   NO PAST SURGERIES      Family History  Problem Relation Age of Onset   Healthy Mother    Healthy Father    Diabetes Maternal Grandmother    Cancer Paternal Grandmother    Cancer Paternal Grandfather     Social History   Tobacco Use   Smoking status: Never    Passive exposure: Yes   Smokeless tobacco: Never   Tobacco comments:    mom smokes outside  Vaping Use   Vaping status: Never Used  Substance Use Topics   Alcohol use: Not Currently   Drug use: Never    Allergies: No Known Allergies  Medications Prior to Admission  Medication Sig Dispense Refill Last Dose/Taking   acetaminophen (TYLENOL) 500 MG tablet Take 500 mg by mouth every 6 (six) hours as needed.      aspirin  EC 81 MG tablet Take 1 tablet (81 mg total) by mouth daily. Start taking when you are [redacted] weeks pregnant for rest of pregnancy for prevention of preeclampsia 300 tablet 2    Blood Pressure Monitoring (BLOOD PRESSURE KIT) DEVI 1 Device by Does not apply route daily. 1 each 0    prenatal vitamin w/FE, FA (PRENATAL 1 + 1) 27-1 MG  TABS tablet Take 1 tablet by mouth daily at 12 noon. 30 tablet 12     ROS reviewed and pertinent positives and negatives as documented in HPI.  Physical Exam   Blood pressure 99/60, pulse 98, temperature 97.7 F (36.5 C), resp. rate 18, height 5' 5 (1.651 m), weight 80.3 kg, last menstrual period 11/04/2022.  Physical Exam General: Alert, well-appearing female, in NAD, pleasant HEENT: Normocephalic, atraumatic, EOM grossly intact Respiratory: Breathing comfortably on room air Abdomen: Soft, non-tender, gravid, appropriate for gestational age Extremities: Moves all four extremities appropriately, normal gait. Neuro: Alert, no obvious focal deficits Skin: No lesions/rashes visualized Psych: Normal affect and mood  Cervical exam:  Dilation: 0.5 Effacement (%): Thick Station: [employer] Exam by: Acquanetta Blumenthal, RN   Fetal Monitoring: Baseline: 140 Variability: moderate Accelerations: 15x15 Decelerations: absent Contractions: rare  MAU Course  Procedures  MDM 23 y.o. G1P0 at [redacted]w[redacted]d presenting for lightheadedness, cramping, and leaking fluid since last night. Concern for ROM so will order ROM plus, fern test, wet prep, and GC/chlamydia in addition to monitoring NST and cervical check.  1657: POCT fern test and ROM plus negative. Wet prep normal. NST is reassuring and cervix 0.5 dilated.  Leaking is likely not due to ROM and contractions are likely false labor. GC/chlamydia still  in process, will update patient. Discussed return precautions.  Assessment and Plan     ICD-10-CM   1. False labor after 37 completed weeks of gestation  O47.1     2. [redacted] weeks gestation of pregnancy  Z3A.39     - Will follow up GC/chlamydia - Continue routine f/u with primary OB  Darren Jernigan, DO 08/06/2023, 3:14 PM   GME ATTESTATION:  Evaluation and management procedures were performed by the [facility] Medicine Resident under my supervision. I was immediately available for direct supervision,  assistance and direction throughout this encounter.  I also confirm that I have verified the information documented in the resident's note, and that I have also personally reperformed the pertinent components of the physical exam and all of the medical decision making activities.  I have also made any necessary editorial changes.  Mardy Shropshire, MD OB Fellow, Faculty Practice [facility], [facility] 08/06/2023 7:02 PM

## 2023-08-07 ENCOUNTER — Ambulatory Visit: Payer: Self-pay | Admitting: Obstetrics and Gynecology

## 2023-08-07 LAB — GC/CHLAMYDIA PROBE AMP (~~LOC~~) NOT AT ARMC
Chlamydia: NEGATIVE
Comment: NEGATIVE
Comment: NORMAL
Neisseria Gonorrhea: NEGATIVE

## 2023-08-08 ENCOUNTER — Other Ambulatory Visit: Payer: Self-pay

## 2023-08-08 ENCOUNTER — Ambulatory Visit (INDEPENDENT_AMBULATORY_CARE_PROVIDER_SITE_OTHER): Admitting: Family Medicine

## 2023-08-08 VITALS — BP 131/88 | HR 84 | Wt 174.0 lb

## 2023-08-08 DIAGNOSIS — Z3A39 39 weeks gestation of pregnancy: Secondary | ICD-10-CM

## 2023-08-08 DIAGNOSIS — Z3493 Encounter for supervision of normal pregnancy, unspecified, third trimester: Secondary | ICD-10-CM

## 2023-08-08 NOTE — Progress Notes (Signed)
   Subjective:  Erin Strickland is a 23 y.o. G1P0 at [redacted]w[redacted]d being seen today for ongoing prenatal care.  She is currently monitored for the following issues for this low-risk pregnancy and has Subchorionic hemorrhage in first trimester and Encounter for supervision of low-risk pregnancy on their problem list.  Patient reports no bleeding, no leaking, and occasional contractions.  Contractions: Not present. Vag. Bleeding: None.  Movement: Present. Denies leaking of fluid.   The following portions of the patient's history were reviewed and updated as appropriate: allergies, current medications, past family history, past medical history, past social history, past surgical history and problem list. Problem list updated.  Objective:   Vitals:   08/08/23 1044  BP: 131/88  Pulse: 84  Weight: 174 lb (78.9 kg)    Fetal Status: Fetal Heart Rate (bpm): 138   Movement: Present     General:  Alert, oriented and cooperative. Patient is in no acute distress.  Skin: Skin is warm and dry. No rash noted.   Cardiovascular: Normal heart rate noted  Respiratory: Normal respiratory effort, no problems with respiration noted  Abdomen: Soft, gravid, appropriate for gestational age. Pain/Pressure: Absent     Pelvic: Vag. Bleeding: None     Cervical exam deferred        Extremities: Normal range of motion.  Edema: None  Mental Status: Normal mood and affect. Normal behavior. Normal judgment and thought content.   Urinalysis:      Assessment and Plan:  Pregnancy: G1P0 at [redacted]w[redacted]d  1. Encounter for supervision of low-risk pregnancy in third trimester (Primary) Doing well, did visit MAU on 08/06/2023 for suspected ROM. ROM ruled out and false labor diagnosed. Went over MAU results with the patient. She has had no more contractions since that time. Discussed stripping membranes, patient defers at this time but would like it done at next visit on 08/14. Scheduled for induction on 08/17.  No complaints. All  questions answered, anticipatory guidance and detailed labor precautions provided.   2. [redacted] weeks gestation of pregnancy   Term labor symptoms and general obstetric precautions including but not limited to vaginal bleeding, contractions, leaking of fluid and fetal movement were reviewed in detail with the patient. Please refer to After Visit Summary for other counseling recommendations.   Return in about 1 week (around 08/15/2023).   Blayne Garlick L, MD  Evaluation and management procedures were performed by the [facility] Medicine Fellow under my supervision. I was immediately available for direct supervision, assistance and direction throughout this encounter.  I also confirm that I have verified the information documented in the fellow's note, and that I have also personally reperformed the pertinent components of the physical exam and all of the medical decision making activities.  I have also made any necessary editorial changes.   Augustin Slade, MD Family Medicine - Obstetrics Fellow

## 2023-08-11 ENCOUNTER — Inpatient Hospital Stay (HOSPITAL_COMMUNITY): Admission: RE | Admit: 2023-08-11 | Source: Home / Self Care | Admitting: Family Medicine

## 2023-08-14 ENCOUNTER — Telehealth (HOSPITAL_COMMUNITY): Payer: Self-pay | Admitting: *Deleted

## 2023-08-14 ENCOUNTER — Encounter (HOSPITAL_COMMUNITY): Payer: Self-pay | Admitting: *Deleted

## 2023-08-14 NOTE — Telephone Encounter (Signed)
 Preadmission screen

## 2023-08-15 ENCOUNTER — Telehealth: Payer: Self-pay

## 2023-08-15 ENCOUNTER — Encounter: Payer: Self-pay | Admitting: Family Medicine

## 2023-08-15 ENCOUNTER — Other Ambulatory Visit: Payer: Self-pay | Admitting: General Practice

## 2023-08-15 ENCOUNTER — Ambulatory Visit (INDEPENDENT_AMBULATORY_CARE_PROVIDER_SITE_OTHER)

## 2023-08-15 ENCOUNTER — Ambulatory Visit: Admitting: Family Medicine

## 2023-08-15 ENCOUNTER — Other Ambulatory Visit: Payer: Self-pay

## 2023-08-15 VITALS — BP 125/88 | HR 94 | Wt 175.0 lb

## 2023-08-15 DIAGNOSIS — Z3493 Encounter for supervision of normal pregnancy, unspecified, third trimester: Secondary | ICD-10-CM

## 2023-08-15 DIAGNOSIS — O480 Post-term pregnancy: Secondary | ICD-10-CM | POA: Diagnosis not present

## 2023-08-15 DIAGNOSIS — Z3A40 40 weeks gestation of pregnancy: Secondary | ICD-10-CM

## 2023-08-15 NOTE — Patient Instructions (Signed)
 Please go to the Maternity Assessment Unit (MAU) at [facility] if you experience vaginal bleeding, leaking/gush of fluid like your water broke, notice decreased movement from your baby after doing kick counts, or if you have contractions the are becoming more intense or more frequent.   Fetal Movement Counts When you're pregnant, you might start feeling your baby move around the middle of your pregnancy. At first, these movements might feel like flutters, rolls, or swishes. As your baby grows, you might feel more kicks and jabs. Around week 28 of your pregnancy, your health care team may ask you to count how often your baby moves. This is important for all pregnancies, but especially for high-risk ones. Counting movements can help lessen the risk of stillbirth.  What is a fetal movement count? A fetal movement count is the number of times that you feel your baby move during a certain amount of time. This may also be called a kick count. There are many ways to do a kick count. Ask your team what is best for you. Pay attention to when your baby is most active. You may notice your baby's sleep and wake cycles. You may also notice things that make your baby move more. When you do a kick count, try to do it: When your baby is normally most active. At the same time each day.  How do I count fetal movements? Find a quiet, comfortable area. Sit or lie down. Write down the date, the start time, and the number of movements you feel. Count kicks, flutters, swishes, rolls, and jabs. Usually, you will feel at least 10 movements within 2 hours. Stop counting after you have felt 10 movements or if you have been counting for 2 hours. Write down the stop time.  Contact a health care provider if:  You don't feel 10 movements in 2 hours. Your baby isn't moving as it usually does. Your baby isn't moving at all. If you're not able to reach your provider, go to an emergency room. This information is not  intended to replace advice given to you by your health care provider. Make sure you discuss any questions you have with your health care provider.  Document Revised: 01/11/2023 Document Reviewed: 01/03/2022 Elsevier Patient Education  2025 [employer].  Signs and Symptoms of Labor Labor is the body's natural process of moving the baby and the placenta out of the uterus. The process of labor usually starts when the baby is full-term, 37 weeks and 0 days or more.   As your body prepares for labor and the birth of your baby, you may notice the following symptoms in the weeks and days before true labor starts: Your baby dropping lower into your pelvis to get into position for birth (lightening). When this happens, you may feel more pressure on your bladder and pelvic bone and less pressure on your ribs. This may make it easier to breathe. It may also cause you to need to urinate more often and have problems with bowel movements. Having practice contractions, also called Braxton Hicks contractions or false labor. These occur at irregular (unevenly spaced) intervals that are more than 10 minutes apart. False labor contractions are common after exercise or sexual activity. They will stop if you change position, rest, or drink fluids. These contractions are usually mild and do not get stronger over time. They may feel like: A backache or back pain. Mild cramps, similar to menstrual cramps. Tightening or pressure in your abdomen. Passing  a small amount of thick, bloody mucus from your vagina. This is called normal bloody show or losing your mucus plug. This may happen more than a week before labor begins, or right before labor begins, as the opening of the cervix starts to widen (dilate). For some women, the entire mucus plug passes at once. For others, pieces of the mucus plug may gradually pass over several days.  Signs and symptoms that labor has begun Signs that you are in labor may  include: Having contractions that come at regular (evenly spaced) intervals and increase in intensity. This may feel like more intense tightening or pressure in your abdomen that moves to your back. Feeling pressure in the vaginal area. Your water breaking (called rupture of membranes). This is when the sac of fluid that surrounds your baby breaks. Fluid leaking from your vagina may be clear or blood-tinged. Labor usually starts within 24 hours of your water breaking, but it may take longer to begin. Some people may feel a sudden gush of fluid; others may notice repeatedly damp underwear.  Go to the hospital when  Your water breaks. Your labor has started: painful, regular contractions that are 5 minutes apart You have a fever. You have bright red blood coming from your vagina. You do not feel your baby moving. You have a severe headache with or without vision problems. You have chest pain or shortness of breath. These symptoms may represent a serious problem that is an emergency. Do not wait to see if the symptoms will go away. Get medical help right away. Call your local emergency services (911 in the [place] Do not drive yourself to the hospital.  Summary Labor is your body's natural process of moving your baby and the placenta out of your uterus. The process of labor usually starts when your baby is full-term When labor starts, or if your water breaks, call your health care provider or nurse care line. Based on your situation, they will determine when you should go in for an exam. This information is not intended to replace advice given to you by your health care provider. Make sure you discuss any questions you have with your health care provider.  Document Revised: 05/03/2020 Document Reviewed: 05/03/2020-- adapted Elsevier Patient Education  2024 [employer].

## 2023-08-15 NOTE — Telephone Encounter (Signed)
 Pt called nurse line on 08/14/2023 complaining of intense back pain. RN could tell through phone that pt was tearful and uncomfortable. Denies any bleeding or leaking of fluid. Given pt is [redacted]w[redacted]d, advised pt to go be evaluated at the maternity assessment unit for a labor check. Pt voiced understanding of information provided and denies any further questions or concerns.   Cyndee Molt, RN  08/14/2023

## 2023-08-15 NOTE — Addendum Note (Signed)
 Addended by: TRUDY SHORES on: 08/15/2023 05:12 PM   Modules accepted: Orders

## 2023-08-15 NOTE — Progress Notes (Signed)
   Subjective:  Erin Strickland is a 23 y.o. G1P0 at [redacted]w[redacted]d being seen today for ongoing prenatal care.  She is currently monitored for the following issues for this low-risk pregnancy and has Encounter for supervision of low-risk pregnancy on their problem list.  Patient reports backache, no bleeding, and occasional contractions.  Contractions: Irritability. Vag. Bleeding: None.  Movement: Present. Denies leaking of fluid.   The following portions of the patient's history were reviewed and updated as appropriate: allergies, current medications, past family history, past medical history, past social history, past surgical history and problem list. Problem list updated.  Objective:   Vitals:   08/15/23 1036  BP: 125/88  Pulse: 94  Weight: 175 lb (79.4 kg)    Fetal Status: Fetal Heart Rate (bpm): 146   Movement: Present    NST: 135/mod/accel+/decel-  General:  Alert, oriented and cooperative. Patient is in no acute distress.  Skin: Skin is warm and dry. No rash noted.   Cardiovascular: Normal heart rate noted  Respiratory: Normal respiratory effort, no problems with respiration noted  Abdomen: Soft, gravid, appropriate for gestational age. Pain/Pressure: Present   FH 39cm  Pelvic: Vag. Bleeding: None     Posterior cervix, not able to examine, no membrane sweep        Extremities: Normal range of motion.  Edema: None  Mental Status: Normal mood and affect. Normal behavior. Normal judgment and thought content.   Urinalysis:      Assessment and Plan:  Pregnancy: G1P0 at [redacted]w[redacted]d  1. Encounter for supervision of low-risk pregnancy in third trimester (Primary) 2. [redacted] weeks gestation of pregnancy BPP 6/8 (breathing), NST reactive. PD IOL scheduled 8/17. Advised to keep phone charged, ringer on, etc Girl/breast/none. EKB trifecta ok.  Term labor symptoms and general obstetric precautions including but not limited to vaginal bleeding, contractions, leaking of fluid and fetal movement were  reviewed in detail with the patient. Please refer to After Visit Summary for other counseling recommendations.  No follow-ups on file.   Barabara Maier, DO

## 2023-08-18 ENCOUNTER — Inpatient Hospital Stay (HOSPITAL_COMMUNITY): Admitting: Anesthesiology

## 2023-08-18 ENCOUNTER — Encounter (HOSPITAL_COMMUNITY): Payer: Self-pay | Admitting: Obstetrics and Gynecology

## 2023-08-18 ENCOUNTER — Other Ambulatory Visit: Payer: Self-pay

## 2023-08-18 ENCOUNTER — Inpatient Hospital Stay (HOSPITAL_COMMUNITY)
Admission: AD | Admit: 2023-08-18 | Discharge: 2023-08-20 | DRG: 806 | Disposition: A | Discharge status: Home / Self Care | Attending: Obstetrics and Gynecology | Admitting: Obstetrics and Gynecology

## 2023-08-18 ENCOUNTER — Inpatient Hospital Stay (HOSPITAL_COMMUNITY): Admission: RE | Admit: 2023-08-18 | Source: Ambulatory Visit

## 2023-08-18 DIAGNOSIS — O9962 Diseases of the digestive system complicating childbirth: Secondary | ICD-10-CM | POA: Diagnosis not present

## 2023-08-18 DIAGNOSIS — O134 Gestational [pregnancy-induced] hypertension without significant proteinuria, complicating childbirth: Secondary | ICD-10-CM | POA: Diagnosis not present

## 2023-08-18 DIAGNOSIS — Z3493 Encounter for supervision of normal pregnancy, unspecified, third trimester: Secondary | ICD-10-CM

## 2023-08-18 DIAGNOSIS — O480 Post-term pregnancy: Secondary | ICD-10-CM | POA: Diagnosis not present

## 2023-08-18 DIAGNOSIS — Z3A41 41 weeks gestation of pregnancy: Secondary | ICD-10-CM | POA: Diagnosis not present

## 2023-08-18 DIAGNOSIS — Z8759 Personal history of other complications of pregnancy, childbirth and the puerperium: Secondary | ICD-10-CM | POA: Diagnosis present

## 2023-08-18 DIAGNOSIS — O1404 Mild to moderate pre-eclampsia, complicating childbirth: Secondary | ICD-10-CM | POA: Diagnosis not present

## 2023-08-18 DIAGNOSIS — Z3490 Encounter for supervision of normal pregnancy, unspecified, unspecified trimester: Principal | ICD-10-CM

## 2023-08-18 DIAGNOSIS — K219 Gastro-esophageal reflux disease without esophagitis: Secondary | ICD-10-CM | POA: Diagnosis present

## 2023-08-18 DIAGNOSIS — Z833 Family history of diabetes mellitus: Secondary | ICD-10-CM | POA: Diagnosis not present

## 2023-08-18 DIAGNOSIS — O1400 Mild to moderate pre-eclampsia, unspecified trimester: Secondary | ICD-10-CM | POA: Diagnosis present

## 2023-08-18 LAB — COMPREHENSIVE METABOLIC PANEL WITH GFR
ALT: 12 U/L (ref 0–44)
AST: 19 U/L (ref 15–41)
Albumin: 2.9 g/dL — ABNORMAL LOW (ref 3.5–5.0)
Alkaline Phosphatase: 255 U/L — ABNORMAL HIGH (ref 38–126)
Anion gap: 8 (ref 5–15)
BUN: 6 mg/dL (ref 6–20)
CO2: 19 mmol/L — ABNORMAL LOW (ref 22–32)
Calcium: 8.8 mg/dL — ABNORMAL LOW (ref 8.9–10.3)
Chloride: 107 mmol/L (ref 98–111)
Creatinine, Ser: 0.57 mg/dL (ref 0.44–1.00)
GFR, Estimated: >60 mL/min (ref >60)
Glucose, Bld: 83 mg/dL (ref 70–99)
Potassium: 3.9 mmol/L (ref 3.5–5.1)
Sodium: 134 mmol/L — ABNORMAL LOW (ref 135–145)
Total Bilirubin: 0.7 mg/dL (ref 0.0–1.2)
Total Protein: 6.3 g/dL — ABNORMAL LOW (ref 6.5–8.1)

## 2023-08-18 LAB — CBC
HCT: 40.8 % (ref 36.0–46.0)
HCT: 39.2 % (ref 36.0–46.0)
Hemoglobin: 13.2 g/dL (ref 12.0–15.0)
Hemoglobin: 13.3 g/dL (ref 12.0–15.0)
MCH: 28.3 pg (ref 26.0–34.0)
MCH: 29 pg (ref 26.0–34.0)
MCHC: 32.4 g/dL (ref 30.0–36.0)
MCHC: 33.9 g/dL (ref 30.0–36.0)
MCV: 87.6 fL (ref 80.0–100.0)
MCV: 85.4 fL (ref 80.0–100.0)
Platelets: 214 K/uL (ref 150–400)
Platelets: 234 K/uL (ref 150–400)
RBC: 4.66 MIL/uL (ref 3.87–5.11)
RBC: 4.59 MIL/uL (ref 3.87–5.11)
RDW: 13.3 % (ref 11.5–15.5)
RDW: 13.2 % (ref 11.5–15.5)
WBC: 6.9 K/uL (ref 4.0–10.5)
WBC: 10.2 K/uL (ref 4.0–10.5)
nRBC: 0.3 % — ABNORMAL HIGH (ref 0.0–0.2)
nRBC: 0 % (ref 0.0–0.2)

## 2023-08-18 LAB — PROTEIN / CREATININE RATIO, URINE
Creatinine, Urine: 38 mg/dL
Protein Creatinine Ratio: 5.24 mg/mg{creat} — ABNORMAL HIGH (ref 0.00–0.15)
Total Protein, Urine: 199 mg/dL

## 2023-08-18 LAB — RPR: RPR Ser Ql: NONREACTIVE

## 2023-08-18 LAB — TYPE AND SCREEN
ABO/RH(D): O POS
Antibody Screen: NEGATIVE

## 2023-08-18 MED ORDER — LIDOCAINE HCL (PF) 1 % IJ SOLN: 30.0000 mL | INTRAMUSCULAR | Status: DC | PRN

## 2023-08-18 MED ORDER — OXYTOCIN BOLUS FROM INFUSION
333.0000 mL | Freq: Once | INTRAVENOUS | Status: AC
  Administered 2023-08-18: 333 mL via INTRAVENOUS

## 2023-08-18 MED ORDER — LACTATED RINGERS IV SOLN
500.0000 mL | Freq: Once | INTRAVENOUS | Status: DC
Start: 2023-08-18 — End: 2023-08-19

## 2023-08-18 MED ORDER — ONDANSETRON HCL 4 MG/2ML IJ SOLN
4.0000 mg | Freq: Four times a day (QID) | INTRAMUSCULAR | Status: DC | PRN
  Administered 2023-08-18: 4 mg via INTRAVENOUS
  Filled 2023-08-18: qty 2

## 2023-08-18 MED ORDER — SOD CITRATE-CITRIC ACID 500-334 MG/5ML PO SOLN: 30.0000 mL | ORAL | Status: DC | PRN

## 2023-08-18 MED ORDER — FENTANYL CITRATE (PF) 100 MCG/2ML IJ SOLN
50.0000 ug | INTRAMUSCULAR | Status: DC | PRN
  Administered 2023-08-18 (×2): 100 ug via INTRAVENOUS
  Filled 2023-08-18 (×2): qty 2

## 2023-08-18 MED ORDER — TRANEXAMIC ACID-NACL 1000-0.7 MG/100ML-% IV SOLN
INTRAVENOUS | Status: AC
  Administered 2023-08-18: 1000 mg
  Filled 2023-08-18: qty 100

## 2023-08-18 MED ORDER — LACTATED RINGERS IV SOLN: INTRAVENOUS | Status: DC

## 2023-08-18 MED ORDER — FENTANYL-BUPIVACAINE-NACL 0.5-0.125-0.9 MG/250ML-% EP SOLN
12.0000 mL/h | EPIDURAL | Status: DC | PRN
  Administered 2023-08-18: 12 mL/h via EPIDURAL
  Filled 2023-08-18: qty 250

## 2023-08-18 MED ORDER — PHENYLEPHRINE 80 MCG/ML (10ML) SYRINGE FOR IV PUSH (FOR BLOOD PRESSURE SUPPORT): 80.0000 ug | PREFILLED_SYRINGE | INTRAVENOUS | Status: DC | PRN

## 2023-08-18 MED ORDER — EPHEDRINE 5 MG/ML INJ: 10.0000 mg | INTRAVENOUS | Status: DC | PRN

## 2023-08-18 MED ORDER — OXYTOCIN-SODIUM CHLORIDE 30-0.9 UT/500ML-% IV SOLN
2.5000 [IU]/h | INTRAVENOUS | Status: DC
  Administered 2023-08-19: 2.5 [IU]/h via INTRAVENOUS
  Filled 2023-08-18: qty 500

## 2023-08-18 MED ORDER — ACETAMINOPHEN 325 MG PO TABS: 650.0000 mg | ORAL_TABLET | ORAL | Status: DC | PRN

## 2023-08-18 MED ORDER — DIPHENHYDRAMINE HCL 50 MG/ML IJ SOLN: 12.5000 mg | INTRAMUSCULAR | Status: DC | PRN

## 2023-08-18 MED ORDER — LIDOCAINE HCL (PF) 1 % IJ SOLN
INTRAMUSCULAR | Status: DC | PRN
  Administered 2023-08-18 (×2): 4 mL via EPIDURAL

## 2023-08-18 MED ORDER — LACTATED RINGERS IV SOLN: 500.0000 mL | INTRAVENOUS | Status: DC | PRN

## 2023-08-18 NOTE — Anesthesia Procedure Notes (Signed)
 Epidural Patient location during procedure: OB Start time: 08/18/2023 6:21 PM End time: 08/18/2023 6:26 PM  Staffing Anesthesiologist: Peggye Delon Brunswick, MD Performed: anesthesiologist   Preanesthetic Checklist Completed: patient identified, IV checked, site marked, risks and benefits discussed, surgical consent, monitors and equipment checked, pre-op evaluation and timeout performed  Epidural Patient position: sitting Prep: DuraPrep and site prepped and draped Patient monitoring: continuous pulse ox and blood pressure Approach: midline Location: L3-L4 Injection technique: LOR saline  Needle:  Needle type: Tuohy  Needle gauge: 17 G Needle length: 9 cm and 9 Needle insertion depth: 6 cm Catheter type: closed end flexible Catheter size: 19 Gauge Catheter at skin depth: 10 cm Test dose: negative  Assessment Events: blood not aspirated, no cerebrospinal fluid, injection not painful, no injection resistance, no paresthesia and negative IV test  Additional Notes The patient has requested an epidural for labor pain management. Risks and benefits including, but not limited to, infection, bleeding, local anesthetic toxicity, headache, hypotension, back pain, block failure, etc. were discussed with the patient. The patient expressed understanding and consented to the procedure. I confirmed that the patient has no bleeding disorders and is not taking blood thinners. I confirmed the patient's last platelet count with the nurse. A time-out was performed immediately prior to the procedure. Please see nursing documentation for vital signs. Sterile technique was used throughout the whole procedure. Once LOR achieved, the epidural catheter threaded easily without resistance. Aspiration of the catheter was negative for blood and CSF. The epidural was dosed slowly and an infusion was started.  1 attempt(s)Reason for block:procedure for pain

## 2023-08-18 NOTE — Plan of Care (Signed)

## 2023-08-18 NOTE — Anesthesia Preprocedure Evaluation (Signed)
 Anesthesia Evaluation  Patient identified by MRN, date of birth, ID band Patient awake    Reviewed: Allergy & Precautions, NPO status , Patient's Chart, lab work & pertinent test results  History of Anesthesia Complications Negative for: history of anesthetic complications  Airway Mallampati: III  TM Distance: >3 FB Neck ROM: Full    Dental   Pulmonary neg pulmonary ROS   Pulmonary exam normal breath sounds clear to auscultation       Cardiovascular hypertension (pre-eclampsia),  Rhythm:Regular Rate:Normal     Neuro/Psych  Headaches, neg Seizures    GI/Hepatic Neg liver ROS,GERD  ,,  Endo/Other  negative endocrine ROS    Renal/GU negative Renal ROS     Musculoskeletal   Abdominal   Peds  Hematology negative hematology ROS (+) Lab Results      Component                Value               Date                      WBC                      10.2                08/18/2023                HGB                      13.3                08/18/2023                HCT                      39.2                08/18/2023                MCV                      85.4                08/18/2023                PLT                      234                 08/18/2023              Anesthesia Other Findings Takes baby aspirin   Reproductive/Obstetrics (+) Pregnancy                              Anesthesia Physical Anesthesia Plan  ASA: 2  Anesthesia Plan: Epidural   Post-op Pain Management:    Induction:   PONV Risk Score and Plan:   Airway Management Planned: Natural Airway  Additional Equipment:   Intra-op Plan:   Post-operative Plan:   Informed Consent: I have reviewed the patients History and Physical, chart, labs and discussed the procedure including the risks, benefits and alternatives for the proposed anesthesia with the patient or authorized representative who has indicated his/her  understanding and acceptance.       Plan Discussed with: Anesthesiologist  Anesthesia Plan Comments: (I have discussed risks of neuraxial anesthesia including but not limited to infection, bleeding, nerve injury, back pain, headache, seizures, and failure of block. Patient denies bleeding disorders and is not currently anticoagulated. Labs have been reviewed. Risks and benefits discussed. All patient's questions answered.  )        Anesthesia Quick Evaluation

## 2023-08-18 NOTE — MAU Note (Signed)
 Erin Strickland is a 23 y.o. at [redacted]w[redacted]d here in MAU reporting: ctxs since 0700 that are 5-7 mins apart. Denies any LOF or VB. Reports +FM Cervical exam last week but was too posterior to reach.   Onset of complaint: ongoing, worse at 0700 Pain score: 8 Vitals:   08/18/23 1007  BP: (!) 147/101  Pulse: 70  Resp: 20  Temp: 97.7 F (36.5 C)  SpO2: 99%     FHT:130 Lab orders placed from triage:  Labor eval

## 2023-08-18 NOTE — Progress Notes (Addendum)
 Patient ID: Erin Strickland, female   DOB: [birth year], 23 y.o.   MRN: [id number]  Received a second dose of Fentanyl  just now; breathing well w ctx; denies s/s pre-e currently; has been up on the shower for a bit  BPs 145/94, 140/82 FHR 120s, +accels, no decels, occ mi variables, Cat 1 Ctx q 3-4 mins Cx deferred (was 4-5/C/vtx -2)  CBC/CMET neg for pre-e P/C ratio: 5.24  IUP@41 .0wks Latent labor Ruled in for pre-e without SF (by mild range elevations and P/C ratio of 5.24)  -Continue expectant management -Anticipate vag delivery  Suzen JONETTA Gentry CNM 08/18/2023 4:51 PM

## 2023-08-18 NOTE — Progress Notes (Signed)
 Labor Progress Note Erin Strickland is a 23 y.o. G1P0 at [redacted]w[redacted]d presented for SOL, with incidental finding of elevated BP. Rules in for PEC without SF (mild elevation of BP and PCR of 5.24)  S:  Comfortable s/p epidural.   O:  BP 131/77   Pulse 94   Temp 97.6 F (36.4 C) (Oral)   Resp 20   Ht 5' 5 (1.651 m)   Wt 77.5 kg   LMP 11/04/2022 (Exact Date)   SpO2 100%   BMI 28.44 kg/m   EFM: baseline 130 bpm/ minimal to moderate variability/ 15x15 accels/ absent decels  Toco/IUPC: 2-5 SVE: Dilation: 9 Effacement (%): 100 Cervical Position: Middle Station: 0, Plus 1 Presentation: Vertex Exam by:: Camie Johnson Pitocin : 0 mu/min  A/P: 23 y.o. G1P0 [redacted]w[redacted]d SOL, with incidental finding of elevated BP. Rules in for PEC without SF (mild elevation of BP and PCR of 5.24) 1. Labor: SOL in active phase, bulging bag. RBA of AROM d/w patient with patient opting for this interventions. AROM completed, moderate amount of clear fluid noted. Patient and fetus tolerated well.  2. FWB: Cat 2, reassuring overall 3. Pain: Comfortable s/p epidural  4. GBS neg 5. PEC - mild range Bps and PCR of 5.24. No neural effects.    Anticipate SVB.  [name], CNM 9:53 PM

## 2023-08-18 NOTE — H&P (Signed)
 HPI: Erin Strickland is a 23 y.o. year old G1P0 female at [redacted]w[redacted]d weeks gestation by 7-week ultrasound consistent with LMP who presents to MAU reporting Labor  Dilation: 2 Effacement (%): 90 Cervical Position: Middle Station: -2 Presentation: Vertex Exam by:: K. Cowher RN  Incidentally found to have mildly elevated blood pressures.  No history of elevated blood pressures per brief chart review and patient report.  Denies headache, blurry vision, epigastric pain, vaginal bleeding or leaking of fluid.  Positive fetal movement.  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 [facility] Dating by LMP c/w U/S at 7 wks  [facility] Model Mom Baby Combined Care Anatomy U/S Nml  Initiated care at  9wks                Language  English              LAB RESULTS   Support Person FOB/Partner--[employer] NIPS: NR Female AFP: normal    NT/IT (FT only)     Carrier Screen Horizon: Nml  Rhogam  O/Positive/-- (01/28 1629) A1C/GTT Early HgbA1C: WNL Third trimester 2 hr GTT: WNL  Flu Vaccine declined    TDaP Vaccine  declined Blood Type O/Positive/-- (01/28 1629)   RSV Vaccine  Antibody Negative (01/28 1629)  COVID Vaccine  Rubella 1.14 (01/28 1629)  Feeding Plan breast RPR Non Reactive (05/14 0827)  Contraception No method  HBsAg Negative (01/28 1629)  Circumcision N/A HIV Non Reactive (05/14 0827)  Pediatrician  MBCC HCVAb Non Reactive (01/28 1629)  Prenatal Classes     BTL Consent  Pap Diagnosis  Date Value Ref Range Status  01/29/2023   Final   - Negative for intraepithelial lesion or malignancy (NILM)    BTL Pre-payment  GC/CT Initial:  neg/neg 36wks:    VBAC Consent  GBS    BRx Optimized? [x]  yes   [ ]  no    DME Rx [ ]  BP cuff [ ]  Weight Scale Waterbirth  [ ]  Class [ ]  Consent [ ]  CNM visit  PHQ9 & GAD7 [  ] new OB [  ] 28 weeks  [  ] 36 weeks Induction  [ ]  Orders Entered [ ] Foley Y/N    OB History     Gravida  1   Para      Term      Preterm      AB      Living         SAB       IAB      Ectopic      Multiple      Live Births             Past Medical History:  Diagnosis Date   Encounter to determine fetal viability of pregnancy 12/07/2022   GERD (gastroesophageal reflux disease)    Headache    Ovarian cyst    Past Surgical History:  Procedure Laterality Date   NO PAST SURGERIES     Family History: family history includes Cancer in her paternal grandfather and paternal grandmother; Diabetes in her maternal grandmother; Healthy in her father and mother. Social History:  reports that she has never smoked. She has been exposed to tobacco smoke. She has never used smokeless tobacco. She reports that she does not currently use alcohol. She reports that she does not use drugs.     Maternal Diabetes: No Genetic Screening: Normal Maternal Ultrasounds/Referrals: Normal Fetal Ultrasounds or other Referrals:  None Maternal Substance  Abuse:  No Significant Maternal Medications:  None Significant Maternal Lab Results:  Group B Strep negative Number of Prenatal Visits:greater than 3 verified prenatal visits Maternal Vaccinations: Declined Other Comments:  Elevated blood pressure without diagnosis of hypertension  Review of Systems  Constitutional:  Negative for chills and fever.  Eyes:  Negative for visual disturbance.  Gastrointestinal:  Positive for abdominal pain. Negative for nausea and vomiting.  Genitourinary:  Negative for vaginal bleeding and vaginal discharge.  Neurological:  Negative for headaches.   Maternal Medical History:  Reason for admission: Nausea.    Dilation: 2 Effacement (%): 90 Station: -2 Exam by:: K. Cowher RN Blood pressure (!) 139/92, pulse 77, temperature 97.7 F (36.5 C), temperature source Oral, resp. rate 20, height 5' 5 (1.651 m), weight 77.5 kg, last menstrual period 11/04/2022, SpO2 98%. Maternal Exam:  Uterine Assessment: Contraction strength is firm.  Contraction frequency is regular.  Abdomen: Patient  reports no abdominal tenderness. Fetal presentation: vertex Introitus: Vagina is negative for discharge.    Fetal Exam Fetal Monitor Review: Baseline rate: 135.  Variability: moderate (6-25 bpm).   Pattern: accelerations present and no decelerations.   Fetal State Assessment: Category I - tracings are normal.   Physical Exam Constitutional:      Appearance: She is well-developed.  HENT:     Head: Normocephalic.  Eyes:     Conjunctiva/sclera: Conjunctivae normal.  Cardiovascular:     Rate and Rhythm: Normal rate and regular rhythm.     Heart sounds: Normal heart sounds.  Pulmonary:     Effort: Pulmonary effort is normal. No respiratory distress.     Breath sounds: Normal breath sounds.  Abdominal:     Palpations: Abdomen is soft.     Tenderness: There is no abdominal tenderness.  Genitourinary:    Vagina: No vaginal discharge or bleeding.  Musculoskeletal:        General: Normal range of motion.     Cervical back: Normal range of motion and neck supple.     Right lower leg: No edema.     Left lower leg: No edema.  Skin:    General: Skin is warm and dry.  Neurological:     Mental Status: She is alert and oriented to person, place, and time.  Psychiatric:        Mood and Affect: Mood normal.     Prenatal labs: ABO, Rh: O/Positive/-- (01/28 1629) Antibody: Negative (01/28 1629) Rubella: 1.14 (01/28 1629) RPR: Non Reactive (05/14 0827)  HBsAg: Negative (01/28 1629)  HIV: Non Reactive (05/14 0827)  GBS: Negative/-- (07/14 1304)   Assessment: 1. Labor: Early, frequent, painful contractions. 2. Fetal Wellbeing: Category 1 3. Pain Control: Desires hydrotherapy.  Completed water birth class in visit with water birth credentialed provider.  Will have to follow blood pressures to see if she risks out due to gestational hypertension. 4. GBS: Negative 5.  41.0 week IUP 6.  High blood pressure without diagnosis of hypertension  Plan:  1. Admit to BS per consult with  MD 2. Routine L&D orders 3. Analgesia/anesthesia PRN  4. Preeclampsia labs  Earnie Bechard  Claudene 08/18/2023, 10:26 AM

## 2023-08-19 ENCOUNTER — Encounter (HOSPITAL_COMMUNITY): Payer: Self-pay | Admitting: Family Medicine

## 2023-08-19 DIAGNOSIS — O43893 Other placental disorders, third trimester: Secondary | ICD-10-CM | POA: Diagnosis not present

## 2023-08-19 DIAGNOSIS — Z3A00 Weeks of gestation of pregnancy not specified: Secondary | ICD-10-CM | POA: Diagnosis not present

## 2023-08-19 LAB — CBC WITH DIFFERENTIAL/PLATELET
Abs Granulocyte: 11.8 K/uL — ABNORMAL HIGH (ref 1.5–6.5)
Abs Immature Granulocytes: 0.1 K/uL — ABNORMAL HIGH (ref 0.00–0.07)
Basophils Absolute: 0 K/uL (ref 0.0–0.1)
Basophils Relative: 0 %
Eosinophils Absolute: 0 K/uL (ref 0.0–0.5)
Eosinophils Relative: 0 %
HCT: 30 % — ABNORMAL LOW (ref 36.0–46.0)
Hemoglobin: 9.9 g/dL — ABNORMAL LOW (ref 12.0–15.0)
Immature Granulocytes: 1 %
Lymphocytes Relative: 16 %
Lymphs Abs: 2.4 K/uL (ref 0.7–4.0)
MCH: 28.9 pg (ref 26.0–34.0)
MCHC: 33 g/dL (ref 30.0–36.0)
MCV: 87.7 fL (ref 80.0–100.0)
Monocytes Absolute: 1.1 K/uL — ABNORMAL HIGH (ref 0.1–1.0)
Monocytes Relative: 7 %
Neutro Abs: 11.8 K/uL — ABNORMAL HIGH (ref 1.7–7.7)
Neutrophils Relative %: 76 %
Platelets: 197 K/uL (ref 150–400)
RBC: 3.42 MIL/uL — ABNORMAL LOW (ref 3.87–5.11)
RDW: 13.4 % (ref 11.5–15.5)
WBC: 15.4 K/uL — ABNORMAL HIGH (ref 4.0–10.5)
nRBC: 0.1 % (ref 0.0–0.2)

## 2023-08-19 MED ORDER — MISOPROSTOL 50MCG HALF TABLET: 50.0000 ug | ORAL_TABLET | ORAL | Status: DC

## 2023-08-19 MED ORDER — CEFAZOLIN SODIUM-DEXTROSE 2-4 GM/100ML-% IV SOLN
2.0000 g | Freq: Three times a day (TID) | INTRAVENOUS | Status: DC
  Filled 2023-08-19: qty 100

## 2023-08-19 MED ORDER — CEFAZOLIN SODIUM-DEXTROSE 2-4 GM/100ML-% IV SOLN
2.0000 g | Freq: Once | INTRAVENOUS | Status: AC
  Administered 2023-08-19: 2 g via INTRAVENOUS
  Filled 2023-08-19: qty 100

## 2023-08-19 MED ORDER — TETANUS-DIPHTH-ACELL PERTUSSIS 5-2.5-18.5 LF-MCG/0.5 IM SUSY: 0.5000 mL | PREFILLED_SYRINGE | Freq: Once | INTRAMUSCULAR | Status: DC

## 2023-08-19 MED ORDER — NIFEDIPINE ER OSMOTIC RELEASE 30 MG PO TB24
30.0000 mg | ORAL_TABLET | Freq: Every day | ORAL | Status: DC
  Administered 2023-08-19 – 2023-08-20 (×2): 30 mg via ORAL
  Filled 2023-08-19 (×2): qty 1

## 2023-08-19 MED ORDER — MISOPROSTOL 200 MCG PO TABS
ORAL_TABLET | ORAL | Status: AC
  Administered 2023-08-19: 800 ug via ORAL
  Filled 2023-08-19: qty 4

## 2023-08-19 MED ORDER — SIMETHICONE 80 MG PO CHEW: 80.0000 mg | CHEWABLE_TABLET | ORAL | Status: DC | PRN

## 2023-08-19 MED ORDER — PRENATAL MULTIVITAMIN CH
1.0000 | ORAL_TABLET | Freq: Every day | ORAL | Status: DC
  Administered 2023-08-19 – 2023-08-20 (×2): 1 via ORAL
  Filled 2023-08-19 (×2): qty 1

## 2023-08-19 MED ORDER — FERROUS SULFATE 325 (65 FE) MG PO TABS: 325.0000 mg | ORAL_TABLET | Freq: Two times a day (BID) | ORAL | Status: DC

## 2023-08-19 MED ORDER — MISOPROSTOL 200 MCG PO TABS
200.0000 ug | ORAL_TABLET | ORAL | Status: AC
  Administered 2023-08-19 – 2023-08-20 (×4): 200 ug via ORAL
  Filled 2023-08-19 (×5): qty 1

## 2023-08-19 MED ORDER — SENNOSIDES-DOCUSATE SODIUM 8.6-50 MG PO TABS
2.0000 | ORAL_TABLET | Freq: Every day | ORAL | Status: DC
  Administered 2023-08-20: 2 via ORAL
  Filled 2023-08-19: qty 2

## 2023-08-19 MED ORDER — ACETAMINOPHEN 325 MG PO TABS: 650.0000 mg | ORAL_TABLET | ORAL | Status: DC | PRN

## 2023-08-19 MED ORDER — CEFAZOLIN SODIUM-DEXTROSE 1-4 GM/50ML-% IV SOLN: 1.0000 g | Freq: Three times a day (TID) | INTRAVENOUS | Status: DC

## 2023-08-19 MED ORDER — ONDANSETRON HCL 4 MG PO TABS: 4.0000 mg | ORAL_TABLET | ORAL | Status: DC | PRN

## 2023-08-19 MED ORDER — FUROSEMIDE 20 MG PO TABS
20.0000 mg | ORAL_TABLET | Freq: Every day | ORAL | Status: DC
  Administered 2023-08-20: 20 mg via ORAL
  Filled 2023-08-19: qty 1

## 2023-08-19 MED ORDER — POTASSIUM CHLORIDE CRYS ER 20 MEQ PO TBCR
20.0000 meq | EXTENDED_RELEASE_TABLET | Freq: Every day | ORAL | Status: DC
Start: 2023-08-20 — End: 2023-08-20
  Administered 2023-08-20: 20 meq via ORAL
  Filled 2023-08-19: qty 1

## 2023-08-19 MED ORDER — DIPHENHYDRAMINE HCL 25 MG PO CAPS: 25.0000 mg | ORAL_CAPSULE | Freq: Four times a day (QID) | ORAL | Status: DC | PRN

## 2023-08-19 MED ORDER — BENZOCAINE-MENTHOL 20-0.5 % EX AERO: 1.0000 | INHALATION_SPRAY | CUTANEOUS | Status: DC | PRN

## 2023-08-19 MED ORDER — FERROUS GLUCONATE 324 (38 FE) MG PO TABS
324.0000 mg | ORAL_TABLET | ORAL | Status: DC
  Administered 2023-08-19: 324 mg via ORAL
  Filled 2023-08-19: qty 1

## 2023-08-19 MED ORDER — COCONUT OIL OIL: 1.0000 | TOPICAL_OIL | Status: DC | PRN

## 2023-08-19 MED ORDER — WITCH HAZEL-GLYCERIN EX PADS: 1.0000 | MEDICATED_PAD | CUTANEOUS | Status: DC | PRN

## 2023-08-19 MED ORDER — ONDANSETRON HCL 4 MG/2ML IJ SOLN: 4.0000 mg | INTRAMUSCULAR | Status: DC | PRN

## 2023-08-19 MED ORDER — IBUPROFEN 600 MG PO TABS
600.0000 mg | ORAL_TABLET | Freq: Four times a day (QID) | ORAL | Status: DC
  Administered 2023-08-19 – 2023-08-20 (×6): 600 mg via ORAL
  Filled 2023-08-19 (×6): qty 1

## 2023-08-19 MED ORDER — DIBUCAINE (PERIANAL) 1 % EX OINT: 1.0000 | TOPICAL_OINTMENT | CUTANEOUS | Status: DC | PRN

## 2023-08-19 NOTE — Lactation Note (Signed)
 This note was copied from a baby's chart. Lactation Consultation Note  Patient Name: Erin Strickland Unijb'd Date: 08/19/2023 Age:22 hours Reason for consult: Initial assessment;1st time breastfeeding;Term;Primapara  P1, Baby skin to skin on mother's chest. Attempted latching but baby sleepy at this time.  Mother states she has viewed drops with hand expression. Continue to hand express before latching. Pacifier use not recommended at this time.   Feed on demand with cues.  Goal 8-12+ times per day after first 24 hrs.  Place baby STS if not cueing.  Spoon feed drops of colostrum to interest baby in feeding. Suggest calling for help as needed.  Maternal Data Has patient been taught Hand Expression?: Yes Does the patient have breastfeeding experience prior to this delivery?: No  Feeding Mother's Current Feeding Choice: Breast Milk  Interventions Interventions: Breast feeding basics reviewed;Assisted with latch;Education;LC Services brochure;CDC milk storage guidelines  Discharge Pump: Personal;DEBP (Spectra )  Consult Status Consult Status: Follow-up from L&D Date: 08/20/23 Follow-up type: In-patient  Erin Dines Boschen  RN, IBCLC 08/19/2023, 10:10 AM

## 2023-08-19 NOTE — Plan of Care (Signed)
  Problem: Education: Goal: Knowledge of Childbirth will improve 08/19/2023 0132 by Elinda Pamula SAUNDERS, RN Outcome: Completed/Met 08/18/2023 2005 by Elinda Pamula SAUNDERS, RN Outcome: Progressing Goal: Ability to make informed decisions regarding treatment and plan of care will improve 08/19/2023 0132 by Elinda Pamula SAUNDERS, RN Outcome: Completed/Met 08/18/2023 2005 by Elinda Pamula SAUNDERS, RN Outcome: Progressing Goal: Ability to state and carry out methods to decrease the pain will improve 08/19/2023 0132 by Elinda Pamula SAUNDERS, RN Outcome: Completed/Met 08/18/2023 2005 by Elinda Pamula SAUNDERS, RN Outcome: Progressing Goal: Individualized Educational Video(s) 08/19/2023 0132 by Elinda Pamula SAUNDERS, RN Outcome: Completed/Met 08/18/2023 2005 by Elinda Pamula SAUNDERS, RN Outcome: Progressing   Problem: Coping: Goal: Ability to verbalize concerns and feelings about labor and delivery will improve 08/19/2023 0132 by Elinda Pamula SAUNDERS, RN Outcome: Completed/Met 08/18/2023 2005 by Elinda Pamula SAUNDERS, RN Outcome: Progressing   Problem: Life Cycle: Goal: Ability to make normal progression through stages of labor will improve 08/19/2023 0132 by Elinda Pamula SAUNDERS, RN Outcome: Completed/Met 08/18/2023 2005 by Elinda Pamula SAUNDERS, RN Outcome: Progressing Goal: Ability to effectively push during vaginal delivery will improve 08/19/2023 0132 by Elinda Pamula SAUNDERS, RN Outcome: Completed/Met 08/18/2023 2005 by Elinda Pamula SAUNDERS, RN Outcome: Progressing   Problem: Role Relationship: Goal: Will demonstrate positive interactions with the child 08/19/2023 0132 by Elinda Pamula SAUNDERS, RN Outcome: Completed/Met 08/18/2023 2005 by Elinda Pamula SAUNDERS, RN Outcome: Progressing   Problem: Safety: Goal: Risk of complications during labor and delivery will decrease 08/19/2023 0132 by Elinda Pamula SAUNDERS, RN Outcome: Completed/Met 08/18/2023 2005 by Elinda Pamula SAUNDERS, RN Outcome: Progressing   Problem: Pain Management: Goal: Relief or control of pain  from uterine contractions will improve 08/19/2023 0132 by Elinda Pamula SAUNDERS, RN Outcome: Completed/Met 08/18/2023 2005 by Elinda Pamula SAUNDERS, RN Outcome: Progressing

## 2023-08-19 NOTE — Anesthesia Postprocedure Evaluation (Signed)
 Anesthesia Post Note  Patient: Erin Strickland  Procedure(s) Performed: AN AD HOC LABOR EPIDURAL     Patient location during evaluation: Mother Baby Anesthesia Type: Epidural Level of consciousness: awake and alert Pain management: pain level controlled Vital Signs Assessment: post-procedure vital signs reviewed and stable Respiratory status: spontaneous breathing, nonlabored ventilation and respiratory function stable Cardiovascular status: stable Postop Assessment: no headache, no backache and epidural receding Anesthetic complications: no   No notable events documented.  Last Vitals:  Vitals:   08/19/23 0324 08/19/23 0729  BP: 133/89 131/74  Pulse: 90 80  Resp: 18 16  Temp: 37.3 C 36.9 C  SpO2: 100% 95%    Last Pain:  Vitals:   08/19/23 0729  TempSrc: Oral  PainSc:    Pain Goal: Patients Stated Pain Goal: 2 (08/18/23 1931)                 STEEN HOOSE

## 2023-08-19 NOTE — Progress Notes (Signed)
 POSTPARTUM PROGRESS NOTE  Subjective: Erin Strickland is a 23 y.o. G1P1001 s/p NSVD at [redacted]w[redacted]d. She reports she is doing well. No acute events overnight. She denies any problems with ambulating, voiding or po intake. Denies nausea or vomiting. She has passed flatus. Pain is well controlled.  Lochia is moderate. Denies lightheadedness/dizziness upon standing. No HA, vision changes, RUQ pain, CP, SOB.  Objective: Blood pressure 131/74, pulse 80, temperature 98.5 F (36.9 C), temperature source Oral, resp. rate 16, height 5' 5 (1.651 m), weight 77.5 kg, last menstrual period 11/04/2022, SpO2 95%, unknown if currently breastfeeding.  Physical Exam:  General: alert, cooperative and no distress Chest: no respiratory distress Abdomen: soft, non-tender  Uterine Fundus: firm and at level of umbilicus Extremities: No calf swelling or tenderness  Trace edema  Recent Labs    08/18/23 1759 08/19/23 0445  HGB 13.3 9.9*  HCT 39.2 30.0*    Assessment/Plan: Erin Strickland is a 23 y.o. G1P1001 s/p NSVD at [redacted]w[redacted]d.  Routine Postpartum Care: Doing well, pain well-controlled.  -- Continue routine care, lactation support  -- Contraception: declines -- Feeding: breast  -- PreE w/o SF: Normotensive PP. Will defer Lasix /K until 8/19 given recent PPH. No severe features -- PPH: Clinically significant blood loss at delivery. Required manual extraction of placenta. Received Ancef  2g x1 PP. Asymptomatic. Will start PO iron . Cytotec  started x24 hours by delivery provider given contraindication for methergine series. Will continue  Dispo: Plan for discharge 8/19.  Nicholaus Almarie HERO, MD OB Fellow 08/19/2023 11:22 AM

## 2023-08-19 NOTE — Discharge Summary (Signed)
 Postpartum Discharge Summary  Date of Service updated***     Patient Name: Erin Strickland DOB: 2000-07-01 MRN: [id number]  Date of admission: 08/18/2023 Delivery date:08/18/2023 Delivering provider: REGINO CREDIT A Date of discharge: 08/19/2023  Admitting diagnosis: Post-dates pregnancy [O48.0] Intrauterine pregnancy: [redacted]w[redacted]d     Secondary diagnosis:  Principal Problem:   Post-dates pregnancy Active Problems:   Mild pre-eclampsia  Additional problems: ***    Discharge diagnosis: Term Pregnancy Delivered and Gestational Hypertension                                              Post partum procedures:*** Augmentation: AROM and Pitocin  Complications: Hemorrhage>1060mL, retained placenta with manual removal  Hospital course: Onset of Labor With Vaginal Delivery      23 y.o. yo G1P0 at [redacted]w[redacted]d was admitted in Latent Labor on 08/18/2023. Labor course was complicated by NA  Membrane Rupture Time/Date: 9:20 PM,08/18/2023  Delivery Method:Vaginal, Spontaneous Operative Delivery:N/A Episiotomy: None Lacerations:  Labial;Vaginal Patient had a postpartum course complicated by ***.  She is ambulating, tolerating a regular diet, passing flatus, and urinating well. Patient is discharged home in stable condition on 08/19/23.  Newborn Data: Birth date:08/18/2023 Birth time:11:52 PM Gender:Female Living status:Living Apgars: ,  Weight:3450 g  Magnesium Sulfate received: No BMZ received: No Rhophylac:N/A MMR:{MMR:30440033} T-DaP:{Tdap:23962} Flu: {Qol:76036} RSV Vaccine received: {RSV:31013} Transfusion:{Transfusion received:30440034}  Immunizations received: Immunization History  Administered Date(s) Administered   DTaP 10/27/2001, 01/19/2002, 02/24/2002, 06/23/2003, 08/29/2005   HIB (PRP-OMP) 01/19/2002, 02/24/2002, 06/23/2003   HPV 9-valent 12/22/2015, 09/22/2018   Hepatitis A 09/03/2006, 09/21/2013   Hepatitis B 10-25-01, 10/27/2001, 12/18/2001, 02/24/2002   IPV  10/27/2001, 01/19/2002, 02/24/2002, 08/29/2005   Influenza,inj,Quad PF,6+ Mos 12/22/2015, 09/22/2018, 01/20/2020, 10/11/2021   MMR 06/23/2003, 08/29/2005   Meningococcal Conjugate 09/21/2013, 09/22/2018   Pneumococcal Conjugate-13 12/18/2001, 01/19/2002, 06/23/2003, 08/29/2005   Td 09/22/2012   Tdap 09/22/2012   Varicella 06/23/2003, 09/03/2006    Physical exam  Vitals:   08/19/23 0046 08/19/23 0101 08/19/23 0115 08/19/23 0131  BP: 90/62 (!) 95/42 117/88 117/86  Pulse: (!) 174 (!) 248 91 98  Resp:      Temp:      TempSrc:      SpO2:      Weight:      Height:       General: {Exam; general:21111117} Lochia: {Desc; appropriate/inappropriate:30686::appropriate} Uterine Fundus: {Desc; firm/soft:30687} Incision: {Exam; incision:21111123} DVT Evaluation: {Exam; dvt:2111122} Labs: Lab Results  Component Value Date   WBC 10.2 08/18/2023   HGB 13.3 08/18/2023   HCT 39.2 08/18/2023   MCV 85.4 08/18/2023   PLT 234 08/18/2023      Latest Ref Rng & Units 08/18/2023   10:48 AM  CMP  Glucose 70 - 99 mg/dL 83   BUN 6 - 20 mg/dL 6   Creatinine 9.55 - 8.99 mg/dL 9.42   Sodium 864 - 854 mmol/L 134   Potassium 3.5 - 5.1 mmol/L 3.9   Chloride 98 - 111 mmol/L 107   CO2 22 - 32 mmol/L 19   Calcium 8.9 - 10.3 mg/dL 8.8   Total Protein 6.5 - 8.1 g/dL 6.3   Total Bilirubin 0.0 - 1.2 mg/dL 0.7   Alkaline Phos 38 - 126 U/L 255   AST 15 - 41 U/L 19   ALT 0 - 44 U/L 12    Edinburgh Score:  No data to display         No data recorded  After visit meds:  Allergies as of 08/19/2023   No Known Allergies   Med Rec must be completed prior to using this [facility]***        Discharge home in stable condition Infant Feeding: {Baby feeding:23562} Infant Disposition:{CHL IP OB HOME WITH FNUYZM:76418} Discharge instruction: per After Visit Summary and Postpartum booklet. Activity: Advance as tolerated. Pelvic rest for 6 weeks.  Diet: {OB ipzu:78888878} Future  Appointments: Future Appointments  Date Time Provider Department Center  09/18/2023  9:35 AM Ilean, Norleen GAILS, MD [facility]   Follow up Visit:  Message sent to [facility] 08/19/23 by Camie Rote, CNM Please schedule this patient for a In person postpartum visit in 6 weeks with the following provider: Any provider. Additional Postpartum F/U:BP check 1 week  Low risk pregnancy complicated by: HTN Delivery mode:  Vaginal, Spontaneous Anticipated Birth Control:  none   08/19/2023 [name], CNM

## 2023-08-20 ENCOUNTER — Other Ambulatory Visit (HOSPITAL_COMMUNITY): Payer: Self-pay

## 2023-08-20 MED ORDER — FUROSEMIDE 20 MG PO TABS
20.0000 mg | ORAL_TABLET | Freq: Every day | ORAL | 0 refills | Status: DC
  Filled 2023-08-20: qty 5, 5d supply, fill #0

## 2023-08-20 MED ORDER — ACETAMINOPHEN 325 MG PO TABS
650.0000 mg | ORAL_TABLET | ORAL | 0 refills | Status: AC | PRN
  Filled 2023-08-20: qty 60, 5d supply, fill #0

## 2023-08-20 MED ORDER — POTASSIUM CHLORIDE CRYS ER 20 MEQ PO TBCR
20.0000 meq | EXTENDED_RELEASE_TABLET | Freq: Every day | ORAL | 0 refills | Status: DC
  Filled 2023-08-20: qty 5, 5d supply, fill #0

## 2023-08-20 MED ORDER — IBUPROFEN 600 MG PO TABS
600.0000 mg | ORAL_TABLET | Freq: Four times a day (QID) | ORAL | 0 refills | Status: AC
  Filled 2023-08-20: qty 30, 8d supply, fill #0

## 2023-08-20 MED ORDER — NIFEDIPINE ER 30 MG PO TB24
30.0000 mg | ORAL_TABLET | Freq: Every day | ORAL | 2 refills | Status: AC
  Filled 2023-08-20: qty 30, 30d supply, fill #0

## 2023-08-20 NOTE — Lactation Note (Signed)
 This note was copied from a baby's chart. Lactation Consultation Note  Patient Name: Erin Strickland Unijb'd Date: 08/20/2023 Age:22 hours  Reason for consult: Follow-up assessment;Term;Primapara;1st time breastfeeding;Breastfeeding assistance  P1, [redacted]w[redacted]d, 6% weight loss  LC follow up visit with mother and her baby Erin. Patient's nurse was present and reports baby had a large emesis and stool about an hour ago. Baby became very fussy and has been sleeping since. Last breastfeeding was at 0540 and missed feeding due to spitting up clear fluids.   Baby was in hercrib and showed feeding cues. Mother is very motivated to breast feed and wants latch assistance. Basic breastfeeding education with baby placed in football hold. This is a new breastfeeding position for mother. Baby aligned to mother's breast and mother expressed colostrum prior to latch. Hand expression and education regarding the use of the manuel pump was reviewed before latch. Baby was eager to latch. Mother was assisted with getting infant's chin deeper into the breast, wider gape, and breast compression to increase colostrum intake by baby and keep baby engaged in the feeding.   Mother is very supported by her family. Maternal grandmother breast fed infant's mother as a child and helpful during the feeding.   Discussed feeding baby with cues, 8-12 times in 24 hours, while keeping baby engaged in the feeding. If she is sleeping and not sucking, remove baby and re-latch. Observe for increase in wets and stools, swallowing, breast fullness and softening after feedings.   Mom made aware of O/P services, breastfeeding support groups, community resources, and our phone # for post-discharge questions.     Maternal Data Has patient been taught Hand Expression?: Yes   LATCH Score Latch: Grasps breast easily, tongue down, lips flanged, rhythmical sucking.  Audible Swallowing: Spontaneous and intermittent  Type of Nipple: Everted  at rest and after stimulation  Comfort (Breast/Nipple): Filling, red/small blisters or bruises, mild/mod discomfort (left nipple tender)  Hold (Positioning): Assistance needed to correctly position infant at breast and maintain latch.  LATCH Score: 8      Interventions Interventions: Breast feeding basics reviewed;Assisted with latch;Skin to skin;Hand express;Breast compression;Adjust position;Support pillows;Position options;Education  Discharge Discharge Education: Engorgement and breast care;Warning signs for feeding baby;Outpatient recommendation Pump: DEBP;Personal  Consult Status Consult Status: Complete Date: 08/20/23    Joshua Rojelio HERO 08/20/2023, 12:24 PM

## 2023-08-20 NOTE — Patient Instructions (Signed)
 If interested in an outpatient lactation consult in office or virtually please reach out to us  at [facility] (First Floor) [address], [place]  Please call [phone] and press 4 for lactation.    Lactation support groups:  [facility], Tuesdays 10:00 am -12:00 pm at [address] on the second floor in the conference room, lactating parents and lap babies welcome.  [email or web address]  [email or web address]   Geraldina Louder, [facility] for [facility]

## 2023-08-21 LAB — SURGICAL PATHOLOGY

## 2023-08-24 ENCOUNTER — Encounter: Payer: Self-pay | Admitting: Obstetrics and Gynecology

## 2023-08-26 ENCOUNTER — Ambulatory Visit

## 2023-08-26 ENCOUNTER — Other Ambulatory Visit: Payer: Self-pay

## 2023-08-26 VITALS — BP 109/72 | HR 102 | Ht 65.0 in | Wt 150.5 lb

## 2023-08-26 DIAGNOSIS — Z0130 Encounter for examination of blood pressure without abnormal findings: Secondary | ICD-10-CM

## 2023-08-26 NOTE — Patient Instructions (Addendum)
 Purhcase Colace over the counter for constipation

## 2023-08-26 NOTE — Progress Notes (Signed)
 Pt here today for BP check s/p vaginal delivery on 08/16/23.  Pt denies headache and changes in vision.  Pt report she takes Nifedipine  30 mg po once daily and last dose was yesterday morning.  BP 109/72 LA.  Pt advised to continue to take BP medication as prescribed, monitor for sx's of elevated BP, and to contact the office with concerns.   Pt verbalized understanding.   Brooklynn Brandenburg,RN  08/26/23

## 2023-08-29 ENCOUNTER — Encounter: Payer: Self-pay | Admitting: Family Medicine

## 2023-09-04 ENCOUNTER — Encounter: Payer: Self-pay | Admitting: Family Medicine

## 2023-09-09 ENCOUNTER — Encounter: Payer: Self-pay | Admitting: Family Medicine

## 2023-09-18 ENCOUNTER — Ambulatory Visit: Admitting: Family Medicine

## 2023-09-20 ENCOUNTER — Other Ambulatory Visit: Payer: Self-pay

## 2023-09-20 ENCOUNTER — Ambulatory Visit: Admitting: Family Medicine

## 2023-09-20 ENCOUNTER — Encounter: Payer: Self-pay | Admitting: Family Medicine

## 2023-09-20 DIAGNOSIS — Z8759 Personal history of other complications of pregnancy, childbirth and the puerperium: Secondary | ICD-10-CM

## 2023-09-20 NOTE — Progress Notes (Signed)
 Post Partum Visit Note  Erin Strickland is a 23 y.o. G56P1001 female who presents for a postpartum visit. She is 4 weeks postpartum following a normal spontaneous vaginal delivery.  I have fully reviewed the prenatal and intrapartum course. The delivery was at [redacted]w[redacted]d gestational weeks.  Anesthesia: epidural. Postpartum course has been good. Feels some pressure when voiding. Baby is doing well. Baby is feeding by both breast and bottle - Similac Sensitive RS. Bleeding no bleeding. Bowel function is normal. Bladder function is normal. Patient is not sexually active. Contraception method is none. Postpartum depression screening: negative.   The pregnancy intention screening data noted above was reviewed. Potential methods of contraception were discussed. The patient elected to proceed with No data recorded.   Edinburgh Postnatal Depression Scale - 09/20/23 1005       Edinburgh Postnatal Depression Scale:  In the Past 7 Days   I have been able to laugh and see the funny side of things. 0    I have looked forward with enjoyment to things. 0    I have blamed myself unnecessarily when things went wrong. 0    I have been anxious or worried for no good reason. 0    I have felt scared or panicky for no good reason. 0    Things have been getting on top of me. 0    I have been so unhappy that I have had difficulty sleeping. 0    I have felt sad or miserable. 0    I have been so unhappy that I have been crying. 1    The thought of harming myself has occurred to me. 0    Edinburgh Postnatal Depression Scale Total 1          Health Maintenance Due  Topic Date Due   Meningococcal B Vaccine (1 of 2 - Standard) Never done   HPV VACCINES (3 - Risk 3-dose series) 01/22/2019   DTaP/Tdap/Td (8 - Td or Tdap) 09/23/2022   Influenza Vaccine  08/02/2023    The following portions of the patient's history were reviewed and updated as appropriate: allergies, current medications, past family history, past  medical history, past social history, past surgical history, and problem list.  Review of Systems Pertinent items noted in HPI and remainder of comprehensive ROS otherwise negative.  Objective:  BP 103/66   Pulse (!) 104   Wt 140 lb (63.5 kg)   LMP 09/15/2023 (Exact Date)   Breastfeeding Yes   BMI 23.30 kg/m    General:  alert, cooperative, and appears stated age   Breasts:  not indicated  Lungs: Comfortalbe on room air  Wound N/a  GU exam:  not indicated        Assessment:   Postpartum care and examination  History of pre-eclampsia  Normal postpartum exam.   Plan:   Essential components of care per ACOG recommendations:  1.  Mood and well being: Patient with negative depression screening today. Reviewed local resources for support.  - Patient tobacco use? No.   - hx of drug use? No.    2. Infant care and feeding:  -Patient currently breastmilk feeding? Yes. Reviewed importance of draining breast regularly to support lactation.  -Social determinants of health (SDOH) reviewed in EPIC. No concerns  3. Sexuality, contraception and birth spacing - Patient does not want a pregnancy in the next year.  Desired family size is 2-3 children.  - Reviewed reproductive life planning. Reviewed contraceptive methods based on  pt preferences and effectiveness.  Patient desired abstinence.   - Discussed birth spacing of 18 months  4. Sleep and fatigue -Encouraged family/partner/community support of 4 hrs of uninterrupted sleep to help with mood and fatigue  5. Physical Recovery  - Discussed patients delivery and complications. She describes her labor as mixed. - Patient had a Vaginal problems after delivery including retained placenta and PPH. Patient had a 1st degree laceration. Perineal healing reviewed. Patient expressed understanding - Patient has urinary incontinence? No. - Patient is safe to resume physical and sexual activity  6.  Health Maintenance - HM due items  addressed No - up to date - Last pap smear  Diagnosis  Date Value Ref Range Status  01/29/2023   Final   - Negative for intraepithelial lesion or malignancy (NILM)   Pap smear not done at today's visit.  -Breast Cancer screening indicated? No.   7. Chronic Disease/Pregnancy Condition follow up: Hypertension 1. Postpartum care and examination   2. History of pre-eclampsia    2. Discussed increased lifetime risk of HTN as well as need for ASA ppx in future pregnancies. Instructed to stop taking Nifedipine  as she is normotensive today, recheck BP at next visit in 1 month.   - PCP follow up   Erin CHRISTELLA Carolus, MD/MPH Attending Family Medicine Physician, [facility] for [facility], [facility]

## 2023-09-20 NOTE — Patient Instructions (Signed)

## 2023-10-01 ENCOUNTER — Encounter: Payer: Self-pay | Admitting: Family Medicine

## 2023-10-01 ENCOUNTER — Telehealth: Payer: Self-pay | Admitting: Lactation Services

## 2023-10-01 NOTE — Telephone Encounter (Signed)
 Calling Joniqua per my chart message to MD requesting assistance in increasing milk supply after stopping for 2 weeks, and MD requesting lactation to reach out to parent Leisa, baby 4 weeks old. Called twice, no answer, left message.   Follow up as needed.  Vermell Pelt IBCLC

## 2023-10-16 ENCOUNTER — Ambulatory Visit

## 2023-10-21 ENCOUNTER — Ambulatory Visit

## 2023-10-21 ENCOUNTER — Other Ambulatory Visit: Payer: Self-pay

## 2023-10-22 NOTE — Progress Notes (Signed)
 Subjective:  Erin Strickland is a G1P1001 here for BP check.  She is 2 months postpartum following a normal spontaneous vaginal delivery.  Objective:  BP 132/72   Pulse 79   Wt 148 lb 14.4 oz (67.5 kg)   BMI 24.78 kg/m . Assessment:   Blood Pressure improved. Patient informed to discontinue Nifedipine  on 9/19.      Cooper Pouch, RMA

## 2024-02-27 ENCOUNTER — Encounter: Admitting: Family Medicine
